# Patient Record
Sex: Male | Born: 1960 | Race: White | Hispanic: No | Marital: Married | Smoking: Never smoker
Health system: Southern US, Community
[De-identification: ages and names within clinical notes are randomized; demographics above are authoritative.]

## PROBLEM LIST (undated history)

## (undated) DIAGNOSIS — E041 Nontoxic single thyroid nodule: Secondary | ICD-10-CM

## (undated) DIAGNOSIS — C4490 Unspecified malignant neoplasm of skin, unspecified: Secondary | ICD-10-CM

## (undated) HISTORY — DX: Unspecified malignant neoplasm of skin, unspecified: C44.90

## (undated) HISTORY — PX: VASECTOMY: SHX75

## (undated) HISTORY — DX: Nontoxic single thyroid nodule: E04.1

---
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
## 2004-04-07 ENCOUNTER — Encounter: Admission: RE | Admit: 2004-04-07 | Discharge: 2004-04-07 | Payer: Self-pay | Admitting: Internal Medicine

## 2004-05-19 ENCOUNTER — Ambulatory Visit: Payer: Self-pay | Admitting: Internal Medicine

## 2004-07-22 ENCOUNTER — Ambulatory Visit: Payer: Self-pay | Admitting: Internal Medicine

## 2004-09-03 ENCOUNTER — Ambulatory Visit: Payer: Self-pay | Admitting: Family Medicine

## 2005-06-09 ENCOUNTER — Ambulatory Visit: Payer: Self-pay | Admitting: Internal Medicine

## 2006-02-17 ENCOUNTER — Ambulatory Visit: Payer: Self-pay | Admitting: Internal Medicine

## 2006-06-02 ENCOUNTER — Ambulatory Visit: Payer: Self-pay | Admitting: Internal Medicine

## 2006-10-03 ENCOUNTER — Ambulatory Visit: Payer: Self-pay | Admitting: Internal Medicine

## 2006-10-03 LAB — CONVERTED CEMR LAB: TSH: 1.22 microintl units/mL (ref 0.35–5.50)

## 2006-10-17 ENCOUNTER — Ambulatory Visit: Payer: Self-pay | Admitting: Internal Medicine

## 2007-01-02 ENCOUNTER — Telehealth: Payer: Self-pay | Admitting: Internal Medicine

## 2007-01-04 ENCOUNTER — Encounter: Admission: RE | Admit: 2007-01-04 | Discharge: 2007-01-04 | Payer: Self-pay | Admitting: Internal Medicine

## 2007-01-13 ENCOUNTER — Telehealth: Payer: Self-pay | Admitting: Internal Medicine

## 2007-10-24 ENCOUNTER — Ambulatory Visit: Payer: Self-pay | Admitting: Internal Medicine

## 2007-11-03 ENCOUNTER — Encounter: Admission: RE | Admit: 2007-11-03 | Discharge: 2007-11-03 | Payer: Self-pay | Admitting: Internal Medicine

## 2007-11-08 ENCOUNTER — Telehealth (INDEPENDENT_AMBULATORY_CARE_PROVIDER_SITE_OTHER): Payer: Self-pay | Admitting: *Deleted

## 2008-05-08 ENCOUNTER — Ambulatory Visit: Payer: Self-pay | Admitting: Internal Medicine

## 2008-06-27 ENCOUNTER — Ambulatory Visit: Payer: Self-pay | Admitting: Family Medicine

## 2009-02-27 ENCOUNTER — Telehealth: Payer: Self-pay | Admitting: Internal Medicine

## 2009-02-27 DIAGNOSIS — E042 Nontoxic multinodular goiter: Secondary | ICD-10-CM | POA: Insufficient documentation

## 2009-03-05 ENCOUNTER — Encounter (INDEPENDENT_AMBULATORY_CARE_PROVIDER_SITE_OTHER): Payer: Self-pay | Admitting: *Deleted

## 2009-03-07 ENCOUNTER — Encounter: Admission: RE | Admit: 2009-03-07 | Discharge: 2009-03-07 | Payer: Self-pay | Admitting: Internal Medicine

## 2009-03-13 ENCOUNTER — Encounter (INDEPENDENT_AMBULATORY_CARE_PROVIDER_SITE_OTHER): Payer: Self-pay | Admitting: *Deleted

## 2009-04-22 ENCOUNTER — Ambulatory Visit: Payer: Self-pay | Admitting: Internal Medicine

## 2009-05-09 ENCOUNTER — Ambulatory Visit: Payer: Self-pay | Admitting: Internal Medicine

## 2009-05-14 ENCOUNTER — Encounter (INDEPENDENT_AMBULATORY_CARE_PROVIDER_SITE_OTHER): Payer: Self-pay | Admitting: *Deleted

## 2009-05-14 LAB — CONVERTED CEMR LAB
ALT: 24 units/L (ref 0–53)
AST: 20 units/L (ref 0–37)
BUN: 12 mg/dL (ref 6–23)
Basophils Absolute: 0 10*3/uL (ref 0.0–0.1)
CO2: 30 meq/L (ref 19–32)
Direct LDL: 95.6 mg/dL
Eosinophils Absolute: 0.3 10*3/uL (ref 0.0–0.7)
GFR calc non Af Amer: 84.17 mL/min (ref >60)
Glucose, Bld: 91 mg/dL (ref 70–99)
HDL: 24.8 mg/dL — ABNORMAL LOW (ref >39.00)
Hemoglobin: 15.9 g/dL (ref 13.0–17.0)
Lymphocytes Relative: 25.3 % (ref 12.0–46.0)
MCHC: 35.1 g/dL (ref 30.0–36.0)
Monocytes Relative: 7.7 % (ref 3.0–12.0)
Neutro Abs: 5.6 10*3/uL (ref 1.4–7.7)
Neutrophils Relative %: 63.5 % (ref 43.0–77.0)
PSA: 0.76 ng/mL (ref 0.10–4.00)
Platelets: 183 10*3/uL (ref 150.0–400.0)
Potassium: 3.8 meq/L (ref 3.5–5.1)
RDW: 11.5 % (ref 11.5–14.6)
Total CHOL/HDL Ratio: 6

## 2009-06-23 ENCOUNTER — Encounter: Payer: Self-pay | Admitting: Internal Medicine

## 2010-03-02 ENCOUNTER — Telehealth (INDEPENDENT_AMBULATORY_CARE_PROVIDER_SITE_OTHER): Payer: Self-pay | Admitting: *Deleted

## 2010-03-09 ENCOUNTER — Encounter (INDEPENDENT_AMBULATORY_CARE_PROVIDER_SITE_OTHER): Payer: Self-pay | Admitting: *Deleted

## 2010-04-03 ENCOUNTER — Encounter: Admission: RE | Admit: 2010-04-03 | Discharge: 2010-04-03 | Payer: Self-pay | Admitting: Internal Medicine

## 2010-08-09 ENCOUNTER — Encounter: Payer: Self-pay | Admitting: Internal Medicine

## 2010-08-18 NOTE — Letter (Signed)
Summary: Unable To Reach-Consult Scheduled  [facility] [place]  [address] [place], [place] [address]   Phone: [phone]  Fax: [phone]    03/09/2010 MRN: [id number]    Dear Mr. Currie,   We have been unable to reach you by phone.  Please contact our office with an updated phone number.       Thank you,  Army Fossa CMA  March 09, 2010 9:01 AM

## 2010-08-18 NOTE — Progress Notes (Signed)
Summary: [place]  (lmom 8/15, 8/16, 8/19)  ---- Converted from flag ---- ---- 01/27/2010 12:03 PM, Cristy Hilts, RN wrote:   ---- 01/27/2010 11:41 AM, Cristy Hilts, RN wrote:   ---- 03/10/2009 9:00 AM, Nolon Rod. Paz MD wrote: u/s due ------------------------------  Left message for pt to call back. Left message for pt to call back. Army Fossa CMA  March 03, 2010 12:55 PM  [email or web address]  March 06, 2010 12:02 PM    will mail pt a letter. Army Fossa CMA  March 09, 2010 8:59 AM  Appended Document: [place]  (lmom 8/15, 8/16, 8/19) Pt called back and he is aware, will schedule [place].   Clinical Lists Changes  Orders: Added new Referral order of Radiology Referral (Radiology) - Signed

## 2011-08-26 ENCOUNTER — Encounter: Payer: Self-pay | Admitting: *Deleted

## 2011-08-26 ENCOUNTER — Encounter: Payer: Self-pay | Admitting: Internal Medicine

## 2011-08-26 ENCOUNTER — Ambulatory Visit (INDEPENDENT_AMBULATORY_CARE_PROVIDER_SITE_OTHER): Payer: BC Managed Care – PPO | Admitting: Internal Medicine

## 2011-08-26 DIAGNOSIS — Z0000 Encounter for general adult medical examination without abnormal findings: Secondary | ICD-10-CM | POA: Insufficient documentation

## 2011-08-26 DIAGNOSIS — Z8639 Personal history of other endocrine, nutritional and metabolic disease: Secondary | ICD-10-CM

## 2011-08-26 DIAGNOSIS — Z862 Personal history of diseases of the blood and blood-forming organs and certain disorders involving the immune mechanism: Secondary | ICD-10-CM

## 2011-08-26 LAB — LIPID PANEL
Cholesterol: 196 mg/dL (ref 0–200)
Total CHOL/HDL Ratio: 6
Triglycerides: 265 mg/dL — ABNORMAL HIGH (ref 0.0–149.0)

## 2011-08-26 LAB — CBC WITH DIFFERENTIAL/PLATELET
Eosinophils Absolute: 0.3 10*3/uL (ref 0.0–0.7)
Eosinophils Relative: 3.2 % (ref 0.0–5.0)
HCT: 46.4 % (ref 39.0–52.0)
Lymphs Abs: 2 10*3/uL (ref 0.7–4.0)
MCHC: 34.7 g/dL (ref 30.0–36.0)
MCV: 92.3 fl (ref 78.0–100.0)
Monocytes Absolute: 0.7 10*3/uL (ref 0.1–1.0)
Neutrophils Relative %: 67.3 % (ref 43.0–77.0)
Platelets: 173 10*3/uL (ref 150.0–400.0)
WBC: 9.5 10*3/uL (ref 4.5–10.5)

## 2011-08-26 LAB — COMPREHENSIVE METABOLIC PANEL
ALT: 27 U/L (ref 0–53)
AST: 22 U/L (ref 0–37)
Albumin: 4.1 g/dL (ref 3.5–5.2)
Alkaline Phosphatase: 64 U/L (ref 39–117)
Potassium: 4.8 mEq/L (ref 3.5–5.1)
Sodium: 138 mEq/L (ref 135–145)
Total Bilirubin: 0.7 mg/dL (ref 0.3–1.2)
Total Protein: 6.8 g/dL (ref 6.0–8.3)

## 2011-08-26 LAB — LDL CHOLESTEROL, DIRECT: Direct LDL: 113.1 mg/dL

## 2011-08-26 LAB — TSH: TSH: 1.15 u[IU]/mL (ref 0.35–5.50)

## 2011-08-26 NOTE — Assessment & Plan Note (Addendum)
Will schedule a ultrasound

## 2011-08-26 NOTE — Assessment & Plan Note (Addendum)
Td 07 Had a flu shot  never have a Cscope, discussed Cscope vs iFOB, iFOB provided, will call when ready for a colonoscopy. Labs Diet and exercise discussed

## 2011-08-26 NOTE — Progress Notes (Signed)
  Subjective:    Patient ID: Nicholas Fox, male    DOB: March 09, 1961, 51 y.o.   MRN: [id number]  HPI Complete physical exam  Past Medical History: skin Ca - followed by derm Thyroid Nodules, s/p U/S x 3, last 03-2010  Past Surgical History: Vasectomy  Family History: HTN - m DM - -- B Leukopenia? -- B CAD - no prostate Ca - no colon ca - no  Social History: Married, 3 children Alcohol use-yes Never Smoked Drug use-no Regular exercise--some , twice a week diet-- does watch , describe his diet is healthy   Review of Systems Denies fever chills or unexplained weight loss No chest pain, shortness of breath or palpitations. Occasional cough in the morning with some mucus production without wheezing. No nausea, vomiting, diarrhea or stomach pain. No heartburn. Rarely he see red blood in the stools, believed to be due to hemorrhoids. No dysuria, gross hematuria or difficulty urinating. No anxiety or depression.     Objective:   Physical Exam  Constitutional: He is oriented to person, place, and time. He appears well-developed and well-nourished. No distress.  HENT:  Head: Normocephalic and atraumatic.  Neck:       Barely palpable thyroid, not tender or nodular  Cardiovascular: Normal rate, regular rhythm and normal heart sounds.   No murmur heard. Pulmonary/Chest: Effort normal and breath sounds normal. No respiratory distress. He has no wheezes. He has no rales.  Abdominal: Soft. Bowel sounds are normal. He exhibits no distension. There is no tenderness. There is no rebound and no guarding.  Genitourinary: Rectum normal and prostate normal. Guaiac negative stool.       Sternal hemorrhoids noted  Musculoskeletal: He exhibits no edema.  Neurological: He is alert and oriented to person, place, and time.  Skin: He is not diaphoretic.  Psychiatric: He has a normal mood and affect. His behavior is normal. Judgment and thought content normal.      Assessment & Plan:

## 2011-08-26 NOTE — Patient Instructions (Signed)
Exercise: 3 hours a week, heart rate ~ 140

## 2011-08-30 ENCOUNTER — Ambulatory Visit (HOSPITAL_BASED_OUTPATIENT_CLINIC_OR_DEPARTMENT_OTHER)
Admission: RE | Admit: 2011-08-30 | Discharge: 2011-08-30 | Disposition: A | Discharge status: Home / Self Care | Payer: BC Managed Care – PPO | Source: Ambulatory Visit | Attending: Internal Medicine | Admitting: Internal Medicine

## 2011-08-30 DIAGNOSIS — E041 Nontoxic single thyroid nodule: Secondary | ICD-10-CM

## 2011-08-30 DIAGNOSIS — Z862 Personal history of diseases of the blood and blood-forming organs and certain disorders involving the immune mechanism: Secondary | ICD-10-CM

## 2011-08-30 DIAGNOSIS — E049 Nontoxic goiter, unspecified: Secondary | ICD-10-CM | POA: Insufficient documentation

## 2011-08-30 DIAGNOSIS — Z09 Encounter for follow-up examination after completed treatment for conditions other than malignant neoplasm: Secondary | ICD-10-CM | POA: Insufficient documentation

## 2011-10-14 ENCOUNTER — Other Ambulatory Visit: Payer: Self-pay | Admitting: *Deleted

## 2011-10-14 DIAGNOSIS — Z0000 Encounter for general adult medical examination without abnormal findings: Secondary | ICD-10-CM

## 2012-09-02 ENCOUNTER — Other Ambulatory Visit: Payer: Self-pay

## 2013-05-24 ENCOUNTER — Other Ambulatory Visit: Payer: Self-pay

## 2014-01-04 ENCOUNTER — Encounter: Payer: Self-pay | Admitting: Internal Medicine

## 2014-01-07 ENCOUNTER — Encounter: Payer: Self-pay | Admitting: Internal Medicine

## 2014-02-22 ENCOUNTER — Ambulatory Visit (AMBULATORY_SURGERY_CENTER): Payer: Self-pay | Admitting: *Deleted

## 2014-02-22 VITALS — Ht 70.0 in | Wt 222.6 lb

## 2014-02-22 DIAGNOSIS — Z1211 Encounter for screening for malignant neoplasm of colon: Secondary | ICD-10-CM

## 2014-02-22 MED ORDER — MOVIPREP 100 G PO SOLR: ORAL | Status: DC

## 2014-02-22 NOTE — Progress Notes (Signed)
No allergies to eggs or soy. No problems with anesthesia.  Pt given Emmi instructions for colonoscopy  No oxygen use  No diet drug use  

## 2014-02-25 ENCOUNTER — Encounter: Payer: Self-pay | Admitting: Internal Medicine

## 2014-03-06 ENCOUNTER — Ambulatory Visit (AMBULATORY_SURGERY_CENTER): Payer: BC Managed Care – PPO | Admitting: Internal Medicine

## 2014-03-06 ENCOUNTER — Encounter: Payer: Self-pay | Admitting: Internal Medicine

## 2014-03-06 VITALS — BP 149/91 | HR 62 | Temp 97.7°F | Resp 22 | Ht 70.0 in | Wt 222.0 lb

## 2014-03-06 DIAGNOSIS — Z1211 Encounter for screening for malignant neoplasm of colon: Secondary | ICD-10-CM

## 2014-03-06 MED ORDER — SODIUM CHLORIDE 0.9 % IV SOLN: 500.0000 mL | INTRAVENOUS | Status: DC

## 2014-03-06 NOTE — Patient Instructions (Signed)
YOU HAD AN ENDOSCOPIC PROCEDURE TODAY AT THE [facility]: Refer to the procedure report that was given to you for any specific questions about what was found during the examination.  If the procedure report does not answer your questions, please call your gastroenterologist to clarify.  If you requested that your care partner not be given the details of your procedure findings, then the procedure report has been included in a sealed envelope for you to review at your convenience later.  YOU SHOULD EXPECT: Some feelings of bloating in the abdomen. Passage of more gas than usual.  Walking can help get rid of the air that was put into your GI tract during the procedure and reduce the bloating. If you had a lower endoscopy (such as a colonoscopy or flexible sigmoidoscopy) you may notice spotting of blood in your stool or on the toilet paper. If you underwent a bowel prep for your procedure, then you may not have a normal bowel movement for a few days.  DIET: Your first meal following the procedure should be a light meal and then it is ok to progress to your normal diet.  A half-sandwich or bowl of soup is an example of a good first meal.  Heavy or fried foods are harder to digest and may make you feel nauseous or bloated.  Likewise meals heavy in dairy and vegetables can cause extra gas to form and this can also increase the bloating.  Drink plenty of fluids but you should avoid alcoholic beverages for 24 hours.  ACTIVITY: Your care partner should take you home directly after the procedure.  You should plan to take it easy, moving slowly for the rest of the day.  You can resume normal activity the day after the procedure however you should NOT DRIVE or use heavy machinery for 24 hours (because of the sedation medicines used during the test).    SYMPTOMS TO REPORT IMMEDIATELY: A gastroenterologist can be reached at any hour.  During normal business hours, 8:30 AM to 5:00 PM Monday through Friday,  call [phone].  After hours and on weekends, please call the GI answering service at [phone] who will take a message and have the physician on call contact you.   Following lower endoscopy (colonoscopy or flexible sigmoidoscopy):  Excessive amounts of blood in the stool  Significant tenderness or worsening of abdominal pains  Swelling of the abdomen that is new, acute  Fever of 100F or higher    FOLLOW UP: If any biopsies were taken you will be contacted by phone or by letter within the next 1-3 weeks.  Call your gastroenterologist if you have not heard about the biopsies in 3 weeks.  Our staff will call the home number listed on your records the next business day following your procedure to check on you and address any questions or concerns that you may have at that time regarding the information given to you following your procedure. This is a courtesy call and so if there is no answer at the home number and we have not heard from you through the emergency physician on call, we will assume that you have returned to your regular daily activities without incident.  SIGNATURES/CONFIDENTIALITY: You and/or your care partner have signed paperwork which will be entered into your electronic medical record.  These signatures attest to the fact that that the information above on your After Visit Summary has been reviewed and is understood.  Full responsibility of the confidentiality   this discharge information lies with you and/or your care-partner.   Information on diverticulosis and high fiber diet given to you today 

## 2014-03-06 NOTE — Op Note (Signed)
[facility]  [address] [place], [id number]   COLONOSCOPY PROCEDURE REPORT  PATIENT: Nicholas Fox, Nicholas Fox  MR#: [id number] BIRTHDATE: 03/17/1961 , 60  yrs. old GENDER: Male ENDOSCOPIST: Eustace Quail, MD REFERRED BW:IOMB Larose Kells, M.D. PROCEDURE DATE:  03/06/2014 PROCEDURE:   Colonoscopy, screening First Screening Colonoscopy - Avg.  risk and is 50 yrs.  old or older Yes.  Prior Negative Screening - Now for repeat screening. N/A  History of Adenoma - Now for follow-up colonoscopy & has been > or = to 3 yrs.  N/A  Polyps Removed Today? No.  Recommend repeat exam, <10 yrs? No. ASA CLASS:   Class I INDICATIONS:average risk screening. MEDICATIONS: MAC sedation, administered by CRNA and propofol (Diprivan) 400mg  IV  DESCRIPTION OF PROCEDURE:   After the risks benefits and alternatives of the procedure were thoroughly explained, informed consent was obtained.  A digital rectal exam revealed no abnormalities of the rectum.   The LB [id number]  endoscope was introduced through the anus and advanced to the cecum, which was identified by both the appendix and ileocecal valve. No adverse events experienced.   The quality of the prep was good, using MoviPrep  The instrument was then slowly withdrawn as the colon was fully examined.  COLON FINDINGS: Moderate diverticulosis was noted  in the right colon and  left colon.   The colon was otherwise normal.  There was no  inflammation, polyps or cancers unless previously stated. Retroflexed views revealed internal hemorrhoids. The time to cecum=4 minutes 01 seconds.  Withdrawal time=12 minutes 30 seconds. The scope was withdrawn and the procedure completed.  COMPLICATIONS: There were no complications.  ENDOSCOPIC IMPRESSION: 1.   Moderate diverticulosis was noted in the right colon and left colon 2.   The colon was otherwise normal  RECOMMENDATIONS: 1. Continue current colorectal screening recommendations for "routine  risk" patients with a repeat colonoscopy in 10 years.   eSigned:  Eustace Quail, MD 03/06/2014 12:20 PM   cc: Kathlene November, MD and The Patient

## 2014-03-06 NOTE — Progress Notes (Signed)
A/ox3, pleased with MAC, report to RN 

## 2014-03-07 ENCOUNTER — Telehealth: Payer: Self-pay

## 2014-03-07 NOTE — Telephone Encounter (Signed)
  Follow up Call-  Call back number 03/06/2014  Post procedure Call Back phone  # [phone]  Permission to leave phone message Yes     Patient questions:  Do you have a fever, pain , or abdominal swelling? No. Pain Score  0 *  Have you tolerated food without any problems? Yes.    Have you been able to return to your normal activities? Yes.    Do you have any questions about your discharge instructions: Diet   No. Medications  No. Follow up visit  No.  Do you have questions or concerns about your Care? No.  Actions: * If pain score is 4 or above: No action needed, pain <4.  No problems per the pt. maw

## 2014-04-25 ENCOUNTER — Ambulatory Visit (INDEPENDENT_AMBULATORY_CARE_PROVIDER_SITE_OTHER): Payer: BC Managed Care – PPO | Admitting: Internal Medicine

## 2014-04-25 ENCOUNTER — Encounter: Payer: Self-pay | Admitting: Internal Medicine

## 2014-04-25 VITALS — BP 120/80 | HR 71 | Temp 98.4°F | Ht 70.0 in | Wt 223.5 lb

## 2014-04-25 DIAGNOSIS — J209 Acute bronchitis, unspecified: Secondary | ICD-10-CM | POA: Insufficient documentation

## 2014-04-25 DIAGNOSIS — J208 Acute bronchitis due to other specified organisms: Secondary | ICD-10-CM

## 2014-04-25 MED ORDER — LEVOFLOXACIN 500 MG PO TABS: 500.0000 mg | ORAL_TABLET | Freq: Every day | ORAL | Status: DC

## 2014-04-25 MED ORDER — HYDROCODONE-HOMATROPINE 5-1.5 MG/5ML PO SYRP: 5.0000 mL | ORAL_SOLUTION | Freq: Four times a day (QID) | ORAL | Status: DC | PRN

## 2014-04-25 NOTE — Progress Notes (Signed)
Pre visit review using our clinic review tool, if applicable. No additional management support is needed unless otherwise documented below in the visit note. 

## 2014-04-25 NOTE — Assessment & Plan Note (Addendum)
Mild to mod, for antibx course,  to f/u any worsening symptoms or concerns, declines cxr 

## 2014-04-25 NOTE — Progress Notes (Signed)
   Subjective:    Patient ID: Nicholas Fox, male    DOB: January 03, 1961, 54 y.o.   MRN: [id number]  HPI  Here with acute onset mild to mod 2-3 wks ST, HA, general weakness and malaise, with prod cough greenish sputum, but Pt denies chest pain, increased sob or doe, wheezing, orthopnea, PND, increased LE swelling, palpitations, dizziness or syncope.  Concerned about pna. Past Medical History  Diagnosis Date  . Thyroid nodule    Past Surgical History  Procedure Laterality Date  . Wisdom tooth extraction  1979    reports that he has never smoked. He has never used smokeless tobacco. He reports that he drinks about .6 ounces of alcohol per week. He reports that he does not use illicit drugs. family history is negative for Colon cancer. No Known Allergies No current outpatient prescriptions on file prior to visit.   No current facility-administered medications on file prior to visit.   Review of Systems All otherwise neg per pt     Objective:   Physical Exam BP 120/80  Pulse 71  Temp(Src) 98.4 F (36.9 C) (Oral)  Ht 5\' 10"  (1.778 m)  Wt 223 lb 8 oz (101.379 kg)  BMI 32.07 kg/m2  SpO2 96% VS noted, mild ill Constitutional: Pt appears well-developed, well-nourished.  HENT: Head: NCAT.  Right Ear: External ear normal.  Left Ear: External ear normal.  Eyes: . Pupils are equal, round, and reactive to light. Conjunctivae and EOM are normal Bilat tm's with mild erythema.  Max sinus areas non tender.  Pharynx with mild erythema, no exudate Neck: Normal range of motion. Neck supple.  Cardiovascular: Normal rate and regular rhythm.   Pulmonary/Chest: Effort normal and breath sounds normal.  - no rales or wheezing Neurological: Pt is alert. Not confused , motor grossly intact Skin: Skin is warm. No rash Psychiatric: Pt behavior is normal. No agitation.     Assessment & Plan:

## 2014-04-25 NOTE — Patient Instructions (Signed)
Please take all new medication as prescribed - the antibiotic, and cough medicine  Please continue all other medications as before, and refills have been done if requested.  Please have the pharmacy call with any other refills you may need.  Please keep your appointments with your specialists as you may have planned      

## 2014-10-23 ENCOUNTER — Other Ambulatory Visit: Payer: Self-pay

## 2014-10-31 ENCOUNTER — Telehealth: Payer: Self-pay | Admitting: Internal Medicine

## 2014-10-31 NOTE — Telephone Encounter (Signed)
Pre visit letter sent  °

## 2014-11-18 ENCOUNTER — Ambulatory Visit (INDEPENDENT_AMBULATORY_CARE_PROVIDER_SITE_OTHER): Payer: BLUE CROSS/BLUE SHIELD | Admitting: Internal Medicine

## 2014-11-18 ENCOUNTER — Encounter: Payer: Self-pay | Admitting: Internal Medicine

## 2014-11-18 VITALS — BP 112/64 | HR 74 | Temp 97.8°F | Ht 70.0 in | Wt 222.0 lb

## 2014-11-18 DIAGNOSIS — Z0000 Encounter for general adult medical examination without abnormal findings: Secondary | ICD-10-CM

## 2014-11-18 DIAGNOSIS — C4490 Unspecified malignant neoplasm of skin, unspecified: Secondary | ICD-10-CM | POA: Insufficient documentation

## 2014-11-18 DIAGNOSIS — E042 Nontoxic multinodular goiter: Secondary | ICD-10-CM

## 2014-11-18 MED ORDER — ZOSTER VACCINE LIVE 19400 UNT/0.65ML ~~LOC~~ SOLR: 0.6500 mL | Freq: Once | SUBCUTANEOUS | Status: DC

## 2014-11-18 NOTE — Assessment & Plan Note (Signed)
Previous ultrasound 2013 revealed stable nodules. No symptoms of hypo-/hyperthyroidism. Will recheck TSH today.

## 2014-11-18 NOTE — Progress Notes (Signed)
Subjective:    Patient ID: Nicholas Fox, male    DOB: 09/19/1960, 54 y.o.   MRN: [id number]  DOS:  11/18/2014 Type of visit - description : cpx Interval history:  Patient is a 54 year old male with a history significant for thyroid nodules in today to re-establish care.   Patient notes occasional pain localized to bilateral achilles tendons. Pain is more significant following activity. Patient has not tried medication to alleviate pain. Patient notes pain usually subsides with rest.  Patient also notes diet and exercise could be better. Patient is attempting to watch diet and get exercise, but there could be room for improvement.     Review of Systems  Constitutional: No fever, chills. No unexplained wt changes. No unusual sweats HEENT: No dental problems, ear discharge, facial swelling, voice changes. No eye discharge, redness or intolerance to light Respiratory: No wheezing or difficulty breathing. No cough , mucus production Cardiovascular: No CP, leg swelling or palpitations GI: no nausea, vomiting, diarrhea or abdominal pain.  No blood in the stools. No dysphagia   Endocrine: No polyphagia, polyuria or polydipsia GU: No dysuria, gross hematuria, difficulty urinating. No urinary urgency or frequency. Musculoskeletal: No joint swellings. Pain over bilateral achilles tendons. Skin: No change in the color of the skin, palor or rash Allergic, immunologic: No environmental allergies or food allergies Neurological: No dizziness or syncope. No headaches. No diplopia, slurred speech, motor deficits, facial numbness Hematological: No enlarged lymph nodes, easy bruising or bleeding Psychiatry: No suicidal ideas, hallucinations, behavior problems or confusion. No unusual/severe anxiety or depression.      Past Medical History  Diagnosis Date  . Thyroid nodule     s/p U/S x 3, last 03-2010  . Skin cancer     Sees dermatology    Past Surgical History  Procedure Laterality Date  . Wisdom  tooth extraction  1979  . Vasectomy      History   Social History  . Marital Status: Married    Spouse Name: N/A  . Number of Children: 3  . Years of Education: N/A   Occupational History  . Real Estate Appraisal    Social History Main Topics  . Smoking status: Never Smoker   . Smokeless tobacco: Never Used  . Alcohol Use: 0.6 oz/week    1 Cans of beer per week     Comment: social  . Drug Use: No  . Sexual Activity: Not on file   Other Topics Concern  . Not on file   Social History Narrative     Family History  Problem Relation Age of Onset  . Colon cancer Neg Hx   . Prostate cancer Neg Hx   . Hypertension Mother   . Diabetes Brother   . CAD Neg Hx     Family History  Problem Relation Age of Onset  . Colon cancer Neg Hx   . Prostate cancer Neg Hx   . Hypertension Mother   . Diabetes Brother   . CAD Neg Hx       Medication List       This list is accurate as of: 11/18/14  8:57 PM.  Always use your most recent med list.               zoster vaccine live (PF) 19400 UNT/0.65ML injection  Commonly known as:  ZOSTAVAX  Inject 19,400 Units into the skin once.           Objective:   Physical Exam  BP 112/64 mmHg  Pulse 74  Temp(Src) 97.8 F (36.6 C) (Oral)  Ht 5\' 10"  (1.778 m)  Wt 222 lb (100.699 kg)  BMI 31.85 kg/m2  SpO2 98%  General:   Well developed, well nourished . NAD.  Neck:  Full range of motion. Supple. No  thyromegaly , normal carotid pulse HEENT:  Normocephalic . Face symmetric, atraumatic Lungs:  CTA B Normal respiratory effort, no intercostal retractions, no accessory muscle use. Heart: RRR,  no murmur.  Abdomen:  Not distended, soft, non-tender. No rebound or rigidity. No mass,organomegaly Muscle skeletal: no pretibial edema bilaterally  Achilles tendons mildly TTP bilaterally otherwise ankle-feet exam neg Skin: Exposed areas without rash. Not pale. Not jaundice Neurologic:  alert & oriented X3.  Speech normal, gait  appropriate for age and unassisted Psych: Cognition and judgment appear intact.  Cooperative with normal attention span and concentration.  Behavior appropriate. No anxious or depressed appearing. Rectal:  External abnormalities: single external hemorrhoid located at proximal right aspect of sphincter. Normal sphincter tone. No rectal masses or tenderness.  Stool brown  Prostate: Prostate gland firm and smooth, no enlargement, nodularity, tenderness, mass, asymmetry or induration.         Assessment & Plan:  Achilles Tendonitis: Patient notes pain over bilateral achilles tendons occasionally. Patient advised on importance of stretching to reduce tightness before activity. Patient instructed to inform us if symptoms worsen.

## 2014-11-18 NOTE — Patient Instructions (Signed)
Get your blood work before you leave    Come back to the office  In 1 year  for a physical exam  Please schedule an appointment at the front desk    Come back fasting

## 2014-11-18 NOTE — Progress Notes (Deleted)
   Subjective:    Patient ID: Nicholas Fox, male    DOB: 1961/07/01, 54 y.o.   MRN: [id number]  DOS:  11/18/2014 Type of visit - description : CPX Interval history:  Past Medical History: skin Ca - followed by derm Thyroid Nodules, s/p U/S x 3, last 03-2010  Past Surgical History: Vasectomy  Family History: HTN - m DM - -- B Leukopenia? -- B CAD - no prostate Ca - no colon ca - no  Social History: Married, 3 children Alcohol use-yes Never Smoked Drug use-no    Review of Systems   Past Medical History  Diagnosis Date  . Thyroid nodule     Past Surgical History  Procedure Laterality Date  . Wisdom tooth extraction  1979    History   Social History  . Marital Status: Married    Spouse Name: N/A  . Number of Children: N/A  . Years of Education: N/A   Occupational History  . Real Estate Appraisal    Social History Main Topics  . Smoking status: Never Smoker   . Smokeless tobacco: Never Used  . Alcohol Use: 0.6 oz/week    1 Cans of beer per week  . Drug Use: No  . Sexual Activity: Not on file   Other Topics Concern  . Not on file   Social History Narrative        Medication List    Notice  As of 11/18/2014  1:54 PM   You have not been prescribed any medications.         Objective:   Physical Exam BP 112/64 mmHg  Pulse 74  Temp(Src) 97.8 F (36.6 C) (Oral)  Ht 5\' 10"  (1.778 m)  Wt 222 lb (100.699 kg)  BMI 31.85 kg/m2  SpO2 98%       Assessment & Plan:

## 2014-11-18 NOTE — Progress Notes (Signed)
Pre visit review using our clinic review tool, if applicable. No additional management support is needed unless otherwise documented below in the visit note. 

## 2014-11-18 NOTE — Assessment & Plan Note (Addendum)
Td 07 Pneumovax discussed, will wait Zostavax discussed, Rx given for vaccine - patient will discuss with insurance.  HIV screen declined  EKG today - nsr, unchanged from previous  Colonoscopy 8/15 - moderate tics, no polyps, next cscope in 2025 Labs - CMP/CBC/FLP/TSH/PSA  Diet and exercise discussed, patient provided information regarding My[employer]itnessPal and other methods of weight loss. Importance of exercise discussed as well.

## 2014-11-19 LAB — COMPREHENSIVE METABOLIC PANEL
ALBUMIN: 4.1 g/dL (ref 3.5–5.2)
ALK PHOS: 66 U/L (ref 39–117)
ALT: 19 U/L (ref 0–53)
AST: 21 U/L (ref 0–37)
BILIRUBIN TOTAL: 0.6 mg/dL (ref 0.2–1.2)
BUN: 15 mg/dL (ref 6–23)
CO2: 30 mEq/L (ref 19–32)
CREATININE: 1.04 mg/dL (ref 0.40–1.50)
Calcium: 9.6 mg/dL (ref 8.4–10.5)
Chloride: 103 mEq/L (ref 96–112)
GFR: 78.74 mL/min (ref >60.00)
GLUCOSE: 93 mg/dL (ref 70–99)
Potassium: 4.1 mEq/L (ref 3.5–5.1)
Sodium: 139 mEq/L (ref 135–145)
Total Protein: 6.7 g/dL (ref 6.0–8.3)

## 2014-11-19 LAB — LIPID PANEL
CHOLESTEROL: 174 mg/dL (ref 0–200)
HDL: 29.5 mg/dL — ABNORMAL LOW (ref >39.00)
NONHDL: 144.5
TRIGLYCERIDES: 307 mg/dL — AB (ref 0.0–149.0)
Total CHOL/HDL Ratio: 6
VLDL: 61.4 mg/dL — ABNORMAL HIGH (ref 0.0–40.0)

## 2014-11-19 LAB — CBC WITH DIFFERENTIAL/PLATELET
Basophils Absolute: 0.1 10*3/uL (ref 0.0–0.1)
Basophils Relative: 1.3 % (ref 0.0–3.0)
EOS PCT: 2.2 % (ref 0.0–5.0)
Eosinophils Absolute: 0.2 10*3/uL (ref 0.0–0.7)
HEMATOCRIT: 45.3 % (ref 39.0–52.0)
Hemoglobin: 15.9 g/dL (ref 13.0–17.0)
Lymphocytes Relative: 22.7 % (ref 12.0–46.0)
Lymphs Abs: 2 10*3/uL (ref 0.7–4.0)
MCHC: 35.1 g/dL (ref 30.0–36.0)
MCV: 90.2 fl (ref 78.0–100.0)
Monocytes Absolute: 0.5 10*3/uL (ref 0.1–1.0)
Monocytes Relative: 5.8 % (ref 3.0–12.0)
Neutro Abs: 6 10*3/uL (ref 1.4–7.7)
Neutrophils Relative %: 68 % (ref 43.0–77.0)
PLATELETS: 191 10*3/uL (ref 150.0–400.0)
RBC: 5.02 Mil/uL (ref 4.22–5.81)
RDW: 12.8 % (ref 11.5–15.5)
WBC: 8.8 10*3/uL (ref 4.0–10.5)

## 2014-11-19 LAB — PSA: PSA: 0.86 ng/mL (ref 0.10–4.00)

## 2014-11-19 LAB — LDL CHOLESTEROL, DIRECT: Direct LDL: 94 mg/dL

## 2014-11-19 LAB — TSH: TSH: 1.06 u[IU]/mL (ref 0.35–4.50)

## 2016-10-11 DIAGNOSIS — D225 Melanocytic nevi of trunk: Secondary | ICD-10-CM | POA: Diagnosis not present

## 2016-10-11 DIAGNOSIS — D485 Neoplasm of uncertain behavior of skin: Secondary | ICD-10-CM | POA: Diagnosis not present

## 2016-10-11 DIAGNOSIS — C4441 Basal cell carcinoma of skin of scalp and neck: Secondary | ICD-10-CM | POA: Diagnosis not present

## 2016-10-11 DIAGNOSIS — D2261 Melanocytic nevi of right upper limb, including shoulder: Secondary | ICD-10-CM | POA: Diagnosis not present

## 2016-11-30 DIAGNOSIS — C4441 Basal cell carcinoma of skin of scalp and neck: Secondary | ICD-10-CM | POA: Diagnosis not present

## 2017-04-20 DIAGNOSIS — L570 Actinic keratosis: Secondary | ICD-10-CM | POA: Diagnosis not present

## 2017-04-20 DIAGNOSIS — Z23 Encounter for immunization: Secondary | ICD-10-CM | POA: Diagnosis not present

## 2017-09-06 ENCOUNTER — Encounter: Payer: BLUE CROSS/BLUE SHIELD | Admitting: Internal Medicine

## 2017-09-08 ENCOUNTER — Telehealth: Payer: Self-pay

## 2017-09-08 DIAGNOSIS — Z0000 Encounter for general adult medical examination without abnormal findings: Secondary | ICD-10-CM

## 2017-09-08 NOTE — Telephone Encounter (Signed)
Fasting labs: CMP, FLP, CBC, TSH, PSA

## 2017-09-08 NOTE — Telephone Encounter (Signed)
Orders placed.

## 2017-09-08 NOTE — Telephone Encounter (Signed)
Pt has appt tomorrow for cpe at 1:20PM- requesting lab orders to come early for draw. Please advise.

## 2017-09-09 ENCOUNTER — Encounter: Payer: Self-pay | Admitting: Internal Medicine

## 2017-09-09 ENCOUNTER — Other Ambulatory Visit (INDEPENDENT_AMBULATORY_CARE_PROVIDER_SITE_OTHER): Payer: 59

## 2017-09-09 ENCOUNTER — Ambulatory Visit (INDEPENDENT_AMBULATORY_CARE_PROVIDER_SITE_OTHER): Payer: 59 | Admitting: Internal Medicine

## 2017-09-09 VITALS — BP 128/64 | HR 64 | Temp 98.1°F | Resp 14 | Ht 70.0 in | Wt 218.1 lb

## 2017-09-09 DIAGNOSIS — E042 Nontoxic multinodular goiter: Secondary | ICD-10-CM

## 2017-09-09 DIAGNOSIS — Z0000 Encounter for general adult medical examination without abnormal findings: Secondary | ICD-10-CM | POA: Diagnosis not present

## 2017-09-09 DIAGNOSIS — Z23 Encounter for immunization: Secondary | ICD-10-CM | POA: Diagnosis not present

## 2017-09-09 LAB — CBC WITH DIFFERENTIAL/PLATELET
Basophils Absolute: 0.1 10*3/uL (ref 0.0–0.1)
Basophils Relative: 0.6 % (ref 0.0–3.0)
EOS ABS: 0.2 10*3/uL (ref 0.0–0.7)
EOS PCT: 2.3 % (ref 0.0–5.0)
HCT: 47.9 % (ref 39.0–52.0)
Hemoglobin: 16.5 g/dL (ref 13.0–17.0)
LYMPHS ABS: 2.3 10*3/uL (ref 0.7–4.0)
Lymphocytes Relative: 28.3 % (ref 12.0–46.0)
MCHC: 34.3 g/dL (ref 30.0–36.0)
MCV: 92.8 fl (ref 78.0–100.0)
MONO ABS: 0.6 10*3/uL (ref 0.1–1.0)
Monocytes Relative: 7.9 % (ref 3.0–12.0)
NEUTROS PCT: 60.9 % (ref 43.0–77.0)
Neutro Abs: 4.9 10*3/uL (ref 1.4–7.7)
Platelets: 205 10*3/uL (ref 150.0–400.0)
RBC: 5.17 Mil/uL (ref 4.22–5.81)
RDW: 12.7 % (ref 11.5–15.5)
WBC: 8 10*3/uL (ref 4.0–10.5)

## 2017-09-09 LAB — COMPREHENSIVE METABOLIC PANEL
ALBUMIN: 4.5 g/dL (ref 3.5–5.2)
ALK PHOS: 58 U/L (ref 39–117)
ALT: 17 U/L (ref 0–53)
AST: 19 U/L (ref 0–37)
BUN: 15 mg/dL (ref 6–23)
CO2: 30 mEq/L (ref 19–32)
Calcium: 9.8 mg/dL (ref 8.4–10.5)
Chloride: 103 mEq/L (ref 96–112)
Creatinine, Ser: 0.98 mg/dL (ref 0.40–1.50)
GFR: 83.49 mL/min (ref >60.00)
Glucose, Bld: 100 mg/dL — ABNORMAL HIGH (ref 70–99)
POTASSIUM: 4.1 meq/L (ref 3.5–5.1)
SODIUM: 139 meq/L (ref 135–145)
TOTAL PROTEIN: 6.8 g/dL (ref 6.0–8.3)
Total Bilirubin: 0.8 mg/dL (ref 0.2–1.2)

## 2017-09-09 LAB — LIPID PANEL
CHOLESTEROL: 201 mg/dL — AB (ref 0–200)
HDL: 34.2 mg/dL — ABNORMAL LOW (ref >39.00)
LDL Cholesterol: 140 mg/dL — ABNORMAL HIGH (ref 0–99)
NonHDL: 166.7
Total CHOL/HDL Ratio: 6
Triglycerides: 135 mg/dL (ref 0.0–149.0)
VLDL: 27 mg/dL (ref 0.0–40.0)

## 2017-09-09 LAB — TSH: TSH: 1.55 u[IU]/mL (ref 0.35–4.50)

## 2017-09-09 LAB — PSA: PSA: 0.96 ng/mL (ref 0.10–4.00)

## 2017-09-09 NOTE — Progress Notes (Signed)
Subjective:    Patient ID: Nicholas Fox, male    DOB: 1961/04/25, 57 y.o.   MRN: [id number]  DOS:  09/09/2017 Type of visit - description : cpx Interval history: Well, no major concerns   Review of Systems Enlarged gland on and off on the submandibular area, patient not sure if left or right  Other than above, a 14 point review of systems is negative    Past Medical History:  Diagnosis Date  . Skin cancer    Sees dermatology  . Thyroid nodule    s/p U/S x 3, last 03-2010    Past Surgical History:  Procedure Laterality Date  . VASECTOMY    . WISDOM TOOTH EXTRACTION  1979    Social History   Socioeconomic History  . Marital status: Married    Spouse name: Not on file  . Number of children: 3  . Years of education: Not on file  . Highest education level: Not on file  Social Needs  . Financial resource strain: Not on file  . Food insecurity - worry: Not on file  . Food insecurity - inability: Not on file  . Transportation needs - medical: Not on file  . Transportation needs - non-medical: Not on file  Occupational History  . Occupation: Real Estate Appraisal  Tobacco Use  . Smoking status: Never Smoker  . Smokeless tobacco: Never Used  Substance and Sexual Activity  . Alcohol use: Yes    Alcohol/week: 0.6 oz    Types: 1 Cans of beer per week    Comment: social  . Drug use: No  . Sexual activity: Not on file  Other Topics Concern  . Not on file  Social History Narrative   Household: pt and wife     Family History  Problem Relation Age of Onset  . Hypertension Mother   . Dementia Mother 71  . Diabetes Brother   . Colon cancer Neg Hx   . Prostate cancer Neg Hx   . CAD Neg Hx      Allergies as of 09/09/2017   No Known Allergies     Medication List    as of 09/09/2017 11:59 PM   You have not been prescribed any medications.        Objective:   Physical Exam BP 128/64 (BP Location: Left Arm, Patient Position: Sitting, Cuff Size: Small)    Pulse 64   Temp 98.1 F (36.7 C) (Oral)   Resp 14   Ht 5\' 10"  (1.778 m)   Wt 218 lb 2 oz (98.9 kg)   SpO2 98%   BMI 31.30 kg/m  General:   Well developed, well nourished . NAD.  Neck: No  thyromegaly.  Submandibular area examined, gland seems symmetric today, not tender or hard. HEENT:  Normocephalic . Face symmetric, atraumatic Lungs:  CTA B Normal respiratory effort, no intercostal retractions, no accessory muscle use. Heart: RRR,  no murmur.  No pretibial edema bilaterally  Abdomen:  Not distended, soft, non-tender. No rebound or rigidity.   Skin: Exposed areas without rash. Not pale. Not jaundice Rectal:  External abnormalities: none. Normal sphincter tone. No rectal masses or tenderness.  No stools found   Prostate: Prostate gland firm and smooth, no enlargement, nodularity, tenderness, mass, asymmetry or induration.  Neurologic:  alert & oriented X3.  Speech normal, gait appropriate for age and unassisted Strength symmetric and appropriate for age.  Psych: Cognition and judgment appear intact.  Cooperative with normal attention span and  concentration.  Behavior appropriate. No anxious or depressed appearing.     Assessment & Plan:   Assessment Thyroid nodules: [place] 2013 stable Sees derm q 6 months : [facility]   Plan: Thyroid nodules: Exam is actually negative, rx thyroid ultrasound. RTC 1 year

## 2017-09-09 NOTE — Progress Notes (Signed)
Pre visit review using our clinic review tool, if applicable. No additional management support is needed unless otherwise documented below in the visit note. 

## 2017-09-09 NOTE — Patient Instructions (Signed)
  GO TO THE FRONT DESK Schedule your next appointment for a physical exam in 1 year

## 2017-09-09 NOTE — Assessment & Plan Note (Addendum)
-  Td 08-2017, had a flu shot; shingrex discussed, not available  -[facility]: Colonoscopy 8/15 - moderate tics, no polyps, next cscope in 2025 -prostate ca screeningL: DRE wnl, PSA nl -Labs done earlier today: CMP, FLP (w/ a LDL of 140 which is ok but could be better) , CBC, TSH, PSA reviewed.  Declined  hep C or HIV -Diet and exercise discussed

## 2017-09-10 DIAGNOSIS — Z09 Encounter for follow-up examination after completed treatment for conditions other than malignant neoplasm: Secondary | ICD-10-CM | POA: Insufficient documentation

## 2017-09-10 NOTE — Assessment & Plan Note (Signed)
Thyroid nodules: Exam is actually negative, rx thyroid ultrasound. RTC 1 year

## 2017-11-10 DIAGNOSIS — D2261 Melanocytic nevi of right upper limb, including shoulder: Secondary | ICD-10-CM | POA: Diagnosis not present

## 2017-11-10 DIAGNOSIS — D2262 Melanocytic nevi of left upper limb, including shoulder: Secondary | ICD-10-CM | POA: Diagnosis not present

## 2017-11-10 DIAGNOSIS — D2272 Melanocytic nevi of left lower limb, including hip: Secondary | ICD-10-CM | POA: Diagnosis not present

## 2018-04-29 DIAGNOSIS — Z23 Encounter for immunization: Secondary | ICD-10-CM | POA: Diagnosis not present

## 2018-06-22 DIAGNOSIS — Z86018 Personal history of other benign neoplasm: Secondary | ICD-10-CM | POA: Diagnosis not present

## 2018-06-22 DIAGNOSIS — D225 Melanocytic nevi of trunk: Secondary | ICD-10-CM | POA: Diagnosis not present

## 2018-06-22 DIAGNOSIS — Z808 Family history of malignant neoplasm of other organs or systems: Secondary | ICD-10-CM | POA: Diagnosis not present

## 2018-07-27 DIAGNOSIS — C44319 Basal cell carcinoma of skin of other parts of face: Secondary | ICD-10-CM | POA: Diagnosis not present

## 2018-07-27 DIAGNOSIS — L570 Actinic keratosis: Secondary | ICD-10-CM | POA: Diagnosis not present

## 2018-07-27 DIAGNOSIS — D485 Neoplasm of uncertain behavior of skin: Secondary | ICD-10-CM | POA: Diagnosis not present

## 2018-08-14 DIAGNOSIS — C44319 Basal cell carcinoma of skin of other parts of face: Secondary | ICD-10-CM | POA: Diagnosis not present

## 2019-02-20 ENCOUNTER — Encounter: Payer: Self-pay | Admitting: Internal Medicine

## 2019-10-08 ENCOUNTER — Ambulatory Visit (INDEPENDENT_AMBULATORY_CARE_PROVIDER_SITE_OTHER): Payer: 59 | Admitting: Internal Medicine

## 2019-10-08 ENCOUNTER — Other Ambulatory Visit: Payer: Self-pay

## 2019-10-08 ENCOUNTER — Encounter: Payer: Self-pay | Admitting: Internal Medicine

## 2019-10-08 VITALS — BP 140/76 | HR 57 | Temp 95.1°F | Resp 16 | Ht 70.0 in | Wt 215.2 lb

## 2019-10-08 DIAGNOSIS — R0683 Snoring: Secondary | ICD-10-CM | POA: Diagnosis not present

## 2019-10-08 DIAGNOSIS — E042 Nontoxic multinodular goiter: Secondary | ICD-10-CM

## 2019-10-08 DIAGNOSIS — Z0000 Encounter for general adult medical examination without abnormal findings: Secondary | ICD-10-CM

## 2019-10-08 DIAGNOSIS — Z0189 Encounter for other specified special examinations: Secondary | ICD-10-CM

## 2019-10-08 LAB — CBC WITH DIFFERENTIAL/PLATELET
Basophils Absolute: 0 10*3/uL (ref 0.0–0.1)
Basophils Relative: 0.6 % (ref 0.0–3.0)
Eosinophils Absolute: 0.2 10*3/uL (ref 0.0–0.7)
Eosinophils Relative: 3.1 % (ref 0.0–5.0)
HCT: 46.1 % (ref 39.0–52.0)
Hemoglobin: 15.8 g/dL (ref 13.0–17.0)
Lymphocytes Relative: 25.8 % (ref 12.0–46.0)
Lymphs Abs: 1.8 10*3/uL (ref 0.7–4.0)
MCHC: 34.4 g/dL (ref 30.0–36.0)
MCV: 92 fl (ref 78.0–100.0)
Monocytes Absolute: 0.6 10*3/uL (ref 0.1–1.0)
Monocytes Relative: 8.5 % (ref 3.0–12.0)
Neutro Abs: 4.4 10*3/uL (ref 1.4–7.7)
Neutrophils Relative %: 62 % (ref 43.0–77.0)
Platelets: 199 10*3/uL (ref 150.0–400.0)
RBC: 5.01 Mil/uL (ref 4.22–5.81)
RDW: 12.6 % (ref 11.5–15.5)
WBC: 7 10*3/uL (ref 4.0–10.5)

## 2019-10-08 LAB — COMPREHENSIVE METABOLIC PANEL
ALT: 14 U/L (ref 0–53)
AST: 19 U/L (ref 0–37)
Albumin: 4.2 g/dL (ref 3.5–5.2)
Alkaline Phosphatase: 62 U/L (ref 39–117)
BUN: 12 mg/dL (ref 6–23)
CO2: 28 mEq/L (ref 19–32)
Calcium: 9.8 mg/dL (ref 8.4–10.5)
Chloride: 103 mEq/L (ref 96–112)
Creatinine, Ser: 0.99 mg/dL (ref 0.40–1.50)
GFR: 77.08 mL/min (ref >60.00)
Glucose, Bld: 92 mg/dL (ref 70–99)
Potassium: 4.2 mEq/L (ref 3.5–5.1)
Sodium: 139 mEq/L (ref 135–145)
Total Bilirubin: 0.7 mg/dL (ref 0.2–1.2)
Total Protein: 6.6 g/dL (ref 6.0–8.3)

## 2019-10-08 LAB — LIPID PANEL
Cholesterol: 178 mg/dL (ref 0–200)
HDL: 31.1 mg/dL — ABNORMAL LOW (ref >39.00)
LDL Cholesterol: 123 mg/dL — ABNORMAL HIGH (ref 0–99)
NonHDL: 146.92
Total CHOL/HDL Ratio: 6
Triglycerides: 121 mg/dL (ref 0.0–149.0)
VLDL: 24.2 mg/dL (ref 0.0–40.0)

## 2019-10-08 LAB — PSA: PSA: 0.92 ng/mL (ref 0.10–4.00)

## 2019-10-08 NOTE — Assessment & Plan Note (Signed)
For CPX Thyroid nodules: On exam, right side of the thyroid gland seems a slightly enlarged, check a [place] Snoring: He has witnessed episodes of apnea by wife, on exam he has crowded throat.  Energy level is good.  I think there is sufficient evidence to rule out OSA, refer to neurology Hemorrhoids: Occasionally RBPR, likely from hemorrhoids, up-to-date on colonoscopies RTC 1 year

## 2019-10-08 NOTE — Assessment & Plan Note (Signed)
-  Td 08-2017 - shingrex s/p 2 per pt (2020) - s/p covid x 1  -CCS: Colonoscopy 8/15 - moderate tics, no polyps, next cscope in 2025 -prostate ca screeningL: DRE wnl, check a PSA -Labs: CMP, FLP, CBC, PSA -Diet and exercise discussed , he seems to be doing well

## 2019-10-08 NOTE — Progress Notes (Signed)
Pre visit review using our clinic review tool, if applicable. No additional management support is needed unless otherwise documented below in the visit note. 

## 2019-10-08 NOTE — Patient Instructions (Signed)
  GO TO THE LAB : Get the blood work     [facility], please reschedule your appointments Come back for    physical exam in 1 year  STOP BY THE FIRST FLOOR: Schedule the thyroid ultrasound

## 2019-10-08 NOTE — Progress Notes (Signed)
   Subjective:    Patient ID: Nicholas Fox, male    DOB: 26-Sep-1960, 59 y.o.   MRN: [id number]  DOS:  10/08/2019 Type of visit - description: CPX In general feeling great, he is suspicious of possible sleep apnea    Review of Systems From time to time sees small amounts of red blood per rectum, he thinks related to hemorrhoids, better with a stool softer.  No rectal pain.  Other than above, a 14 point review of systems is negative    Past Medical History:  Diagnosis Date  . Skin cancer    Sees dermatology  . Thyroid nodule    s/p U/S x 3, last 03-2010    Past Surgical History:  Procedure Laterality Date  . VASECTOMY    . WISDOM TOOTH EXTRACTION  1979    Family History  Problem Relation Age of Onset  . Hypertension Mother   . Dementia Mother 37  . Diabetes Brother   . Colon cancer Neg Hx   . Prostate cancer Neg Hx   . CAD Neg Hx     Allergies as of 10/08/2019   No Known Allergies     Medication List    as of October 08, 2019  2:58 PM   You have not been prescribed any medications.        Objective:   Physical Exam BP 140/76 (BP Location: Left Arm, Patient Position: Sitting, Cuff Size: Normal)   Pulse (!) 57   Temp (!) 95.1 F (35.1 C) (Temporal)   Resp 16   Ht 5\' 10"  (1.778 m)   Wt 215 lb 4 oz (97.6 kg)   SpO2 100%   BMI 30.89 kg/m  General: Well developed, NAD, BMI noted Neck: Slightly large right side of the thyroid?  No lymph nodes or other abnormalities HEENT:  Normocephalic . Face symmetric, atraumatic.  Throat is crowded but otherwise normal Lungs:  CTA B Normal respiratory effort, no intercostal retractions, no accessory muscle use. Heart: RRR,  no murmur.  Abdomen:  Not distended, soft, non-tender. No rebound or rigidity.   Lower extremities: no pretibial edema bilaterally DRE: External he has multiple skin tags.  Sphincter normal, no stools, prostate normal. Skin: Exposed areas without rash. Not pale. Not jaundice Neurologic:  alert &  oriented X3.  Speech normal, gait appropriate for age and unassisted Strength symmetric and appropriate for age.  Psych: Cognition and judgment appear intact.  Cooperative with normal attention span and concentration.  Behavior appropriate. No anxious or depressed appearing.     Assessment      Assessment Thyroid nodules: [place] 2013 stable Sees derm q 6 months : [facility]    Plan: For CPX Thyroid nodules: On exam, right side of the thyroid gland seems a slightly enlarged, check a [place] Snoring: He has witnessed episodes of apnea by wife, on exam he has crowded throat.  Energy level is good.  I think there is sufficient evidence to rule out OSA, refer to neurology Hemorrhoids: Occasionally RBPR, likely from hemorrhoids, up-to-date on colonoscopies RTC 1 year   This visit occurred during the SARS-CoV-2 public health emergency.  Safety protocols were in place, including screening questions prior to the visit, additional usage of staff PPE, and extensive cleaning of exam room while observing appropriate contact time as indicated for disinfecting solutions.

## 2019-10-09 ENCOUNTER — Ambulatory Visit (HOSPITAL_BASED_OUTPATIENT_CLINIC_OR_DEPARTMENT_OTHER)
Admission: RE | Admit: 2019-10-09 | Discharge: 2019-10-09 | Disposition: A | Discharge status: Home / Self Care | Payer: 59 | Source: Ambulatory Visit | Attending: Internal Medicine | Admitting: Internal Medicine

## 2019-10-09 DIAGNOSIS — E042 Nontoxic multinodular goiter: Secondary | ICD-10-CM | POA: Insufficient documentation

## 2019-10-17 ENCOUNTER — Encounter: Payer: Self-pay | Admitting: Neurology

## 2019-10-17 ENCOUNTER — Other Ambulatory Visit: Payer: Self-pay

## 2019-10-17 ENCOUNTER — Ambulatory Visit (INDEPENDENT_AMBULATORY_CARE_PROVIDER_SITE_OTHER): Payer: 59 | Admitting: Neurology

## 2019-10-17 VITALS — BP 144/89 | HR 58 | Temp 97.2°F | Ht 70.0 in | Wt 216.5 lb

## 2019-10-17 DIAGNOSIS — E669 Obesity, unspecified: Secondary | ICD-10-CM

## 2019-10-17 DIAGNOSIS — R351 Nocturia: Secondary | ICD-10-CM | POA: Diagnosis not present

## 2019-10-17 DIAGNOSIS — G2581 Restless legs syndrome: Secondary | ICD-10-CM

## 2019-10-17 DIAGNOSIS — R0681 Apnea, not elsewhere classified: Secondary | ICD-10-CM

## 2019-10-17 DIAGNOSIS — R0683 Snoring: Secondary | ICD-10-CM | POA: Diagnosis not present

## 2019-10-17 NOTE — Patient Instructions (Signed)

## 2019-10-17 NOTE — Progress Notes (Signed)
Subjective:    Patient ID: Nicholas Fox is a 59 y.o. male.  HPI     Nicholas Age, MD, PhD [facility] [address] [place], [place] [address]  Dear Dr. Larose Fox,   I saw your patient, Nicholas Fox, upon your kind request in my sleep clinic today for initial consultation of his sleep disorder, in particular, concern for underlying obstructive sleep apnea.  The patient is unaccompanied today.  As you know, [name] is a 59 year old right-handed gentleman with an underlying benign medical history with the exception of thyroid nodules and mild obesity, who reports snoring and witn. Breathing pauses per wife's report to him. I reviewed your office note from 10/08/2019.  His Epworth sleepiness score is 7 out of 24 today, fatigue severity score is 18 out of 63.  He reports some sleep disruption from nocturia which is typically once or twice per average night.  He denies recurrent morning headaches.  He suspects that his brother may have sleep apnea but no formal family history of sleep apnea is known.  He does have some intermittent restless legs type symptoms.  He is a restless sleeper.  He typically tries to be in bed between 10 and 11, rise time is generally between 6 and 630.  He does have 2 travel a little bit for work.  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  He drinks coffee in the mornings, 1 or 2 cups and occasional unsweetened tea for lunch, he is a non-smoker and drinks alcohol occasionally, maybe 2 beers on a weekend.  His mother has restless leg syndrome.  He lives with his wife, they have grown children, 2 daughters, who own a bakery together and 1 son, who is a Nurse, adult, all local.  No grandchildren yet.  They have a dog in the household, typically the dog may sleep in the bedroom with them.  He has a TV in the bedroom but typically does not watch it at night and tries to keep his phone away at bedtime.  He is working on weight  loss.  He is physically quite active, uses his treadmill and stationary bike regularly.  He tries to drink more water.  His Past Medical History Is Significant For: Past Medical History:  Diagnosis Date  . Skin cancer    Sees dermatology  . Thyroid nodule    s/p U/S x 3, last 03-2010    His Past Surgical History Is Significant For: Past Surgical History:  Procedure Laterality Date  . VASECTOMY    . [place]    His Family History Is Significant For: Family History  Problem Relation Fox of Onset  . Hypertension Mother   . Dementia Mother 47  . Diabetes Brother   . Colon cancer Neg Hx   . Prostate cancer Neg Hx   . CAD Neg Hx     His Social History Is Significant For: Social History   Socioeconomic History  . Marital status: Married    Spouse name: Not on file  . Number of children: 3  . Years of education: Not on file  . Highest education level: Not on file  Occupational History  . Occupation: [employer]- hotel, business  Tobacco Use  . Smoking status: Never Smoker  . Smokeless tobacco: Never Used  Substance and Sexual Activity  . Alcohol use: Yes    Alcohol/week: 1.0 standard drinks  Types: 1 Cans of beer per week    Comment: social  . Drug use: No  . Sexual activity: Not on file  Other Topics Concern  . Not on file  Social History Narrative   Household: pt and wife   Social Determinants of Radio broadcast assistant Strain:   . Difficulty of Paying Living Expenses:   Food Insecurity:   . Worried About Charity fundraiser in the Last Year:   . Arboriculturist in the Last Year:   Transportation Needs:   . Film/video editor (Medical):   Marland Kitchen Lack of Transportation (Non-Medical):   Physical Activity:   . Days of Exercise per Week:   . Minutes of Exercise per Session:   Stress:   . Feeling of Stress :   Social Connections:   . Frequency of Communication with Friends and Family:   . Frequency of Social Gatherings  with Friends and Family:   . Attends Religious Services:   . Active Member of Clubs or Organizations:   . Attends Archivist Meetings:   Marland Kitchen Marital Status:     His Allergies Are:  No Known Allergies:   His Current Medications Are:  No outpatient encounter medications on file as of 10/17/2019.   No facility-administered encounter medications on file as of 10/17/2019.  :  Review of Systems:  Out of a complete 14 point review of systems, all are reviewed and negative with the exception of these symptoms as listed below:  Review of Systems  Neurological:       Pt here for sleep apnea, referred by Dr. Larose Fox, No sleep study. ESS 7.    Objective:  Neurological Exam  Physical Exam Physical Examination:   Vitals:   10/17/19 0749  BP: (!) 144/89  Pulse: (!) 58  Temp: (!) 97.2 F (36.2 C)    General Examination: The patient is a very pleasant 59 y.o. male in no acute distress. He appears well-developed and well-nourished and well groomed.   HEENT: Normocephalic, atraumatic, pupils are equal, round and reactive to light, extraocular tracking is good without limitation to gaze excursion or nystagmus noted. Hearing is grossly intact. Face is symmetric with normal facial animation. Speech is clear with no dysarthria noted. There is no hypophonia. There is no lip, neck/head, jaw or voice tremor. Neck is supple with full range of passive and active motion. There are no carotid bruits on auscultation. Oropharynx exam reveals: mild mouth dryness, adequate dental hygiene and moderate airway crowding, due to smaller airway entry, fairly sensitive gag reflex, Mallampati class III, tonsils in place but not fully visualized, larger uvula noted.  Tongue protrudes centrally in palate elevates symmetrically, neck circumference is 17-1/8 inches.  He has a minimal overbite.  Chest: Clear to auscultation without wheezing, rhonchi or crackles noted.  Heart: S1+S2+0, regular and normal without  murmurs, rubs or gallops noted.   Abdomen: Soft, non-tender and non-distended with normal bowel sounds appreciated on auscultation.  Extremities: There is no pitting edema in the distal lower extremities bilaterally.   Skin: Warm and dry without trophic changes noted.   Musculoskeletal: exam reveals no obvious joint deformities, tenderness or joint swelling or erythema.   Neurologically:  Mental status: The patient is awake, alert and oriented in all 4 spheres. His immediate and remote memory, attention, language skills and fund of knowledge are appropriate. There is no evidence of aphasia, agnosia, apraxia or anomia. Speech is clear with normal prosody and enunciation. Thought  process is linear. Mood is normal and affect is normal.  Cranial nerves II - XII are as described above under HEENT exam.  Motor exam: Normal bulk, strength and tone is noted. There is no tremor, Romberg is negative. Fine motor skills and coordination: grossly intact.  Cerebellar testing: No dysmetria or intention tremor. There is no truncal or gait ataxia.  Sensory exam: intact to light touch in the upper and lower extremities.  Gait, station and balance: He stands easily. No veering to one side is noted. No leaning to one side is noted. Posture is Fox-appropriate and stance is narrow based. Gait shows normal stride length and normal pace. No problems turning are noted. Tandem walk is unremarkable.                Assessment and Plan:  In summary, Nicholas Fox is a very pleasant 59 y.o.-year old male with an underlying benign medical history with the exception of thyroid nodules and mild obesity, whose history and physical exam are concerning for obstructive sleep apnea (OSA). I had a long chat with the patient about my findings and the diagnosis of OSA, its prognosis and treatment options. We talked about medical treatments, surgical interventions and non-pharmacological approaches. I explained in particular the risks and  ramifications of untreated moderate to severe OSA, especially with respect to developing cardiovascular disease down the Road, including congestive heart failure, difficult to treat hypertension, cardiac arrhythmias, or stroke. Even type 2 diabetes has, in part, been linked to untreated OSA. Symptoms of untreated OSA include daytime sleepiness, memory problems, mood irritability and mood disorder such as depression and anxiety, lack of energy, as well as recurrent headaches, especially morning headaches. We talked about trying to maintain a healthy lifestyle in general, as well as the importance of weight control. We also talked about the importance of good sleep hygiene. I recommended the following at this time: sleep study.   I explained the sleep test procedure to the patient and also outlined possible surgical and non-surgical treatment options of OSA, including the use of a custom-made dental device (which would require a referral to a specialist dentist or oral surgeon), upper airway surgical options, such as traditional UPPP or a novel less invasive surgical option in the form of Inspire hypoglossal nerve stimulation (which would involve a referral to an ENT surgeon). I also explained the CPAP treatment option to the patient, who indicated that he would be willing to try CPAP if the need arises. I explained the importance of being compliant with PAP treatment, not only for insurance purposes but primarily to improve His symptoms, and for the patient's long term health benefit, including to reduce His cardiovascular risks. I answered all his questions today and the patient was in agreement. I plan to see him back after the sleep study is completed and encouraged him to call with any interim questions, concerns, problems or updates.   Thank you very much for allowing me to participate in the care of this nice patient. If I can be of any further assistance to you please do not hesitate to call me at  [phone].  Sincerely,   Nicholas Age, MD, PhD

## 2019-11-21 ENCOUNTER — Other Ambulatory Visit: Payer: Self-pay

## 2019-11-21 ENCOUNTER — Ambulatory Visit (INDEPENDENT_AMBULATORY_CARE_PROVIDER_SITE_OTHER): Payer: 59 | Admitting: Neurology

## 2019-11-21 DIAGNOSIS — G4733 Obstructive sleep apnea (adult) (pediatric): Secondary | ICD-10-CM | POA: Diagnosis not present

## 2019-11-21 DIAGNOSIS — G2581 Restless legs syndrome: Secondary | ICD-10-CM

## 2019-11-21 DIAGNOSIS — E669 Obesity, unspecified: Secondary | ICD-10-CM

## 2019-11-21 DIAGNOSIS — R0681 Apnea, not elsewhere classified: Secondary | ICD-10-CM

## 2019-11-21 DIAGNOSIS — R351 Nocturia: Secondary | ICD-10-CM

## 2019-11-21 DIAGNOSIS — R0683 Snoring: Secondary | ICD-10-CM

## 2019-11-27 NOTE — Progress Notes (Signed)
Patient referred by Dr. Larose Kells, seen by me on 10/17/19, HST on 11/21/19.    Please call and notify the patient that the recent home sleep test showed obstructive sleep apnea in the moderate range. While I recommend treatment for this in the form CPAP, his insurance will not approve a sleep study for this. They will likely only approve a trial of autoPAP, which means, that we don't have to bring him in for a sleep study with CPAP, but will let him try an autoPAP machine at home, through a DME company (of his choice, or as per insurance requirement). The DME representative will educate him on how to use the machine, how to put the mask on, etc. I have placed an order in the chart. Please send referral, talk to patient, send report to referring MD. We will need a FU in sleep clinic for 10 weeks post-PAP set up, please arrange that with me or one of our NPs. Thanks,   Star Age, MD, PhD [facility])

## 2019-11-27 NOTE — Procedures (Signed)
Patient Information     First Name: Nicholas Last Name: Fox ID: [id number]  Birth Date: [birth year] Age: 59 Gender: Male  Referring Provider: Colon Branch, MD BMI: 30.9 (W=216 lb, H=5' 10'')  Neck Circ.:  17 '' Epworth:  7/24   Sleep Study Information    Study Date: Nov 21, 2019 S/H/A Version: 001.001.001.001 / 4.1.1528 / 31  History:    59 year old man with a history with the exception of thyroid nodules and mild obesity, who reports snoring and witnessed apneas.  Summary & Diagnosis:     OSA Recommendations:     This home sleep test demonstrates moderate obstructive sleep apnea with a total AHI of 28/hour and O2 nadir of 84%. The patient will be advised to start autoPAP therapy at home, as his insurance has denied an attended sleep study. Please note that untreated obstructive sleep apnea may carry additional perioperative morbidity. Patients with significant obstructive sleep apnea should receive perioperative PAP therapy and the surgeons and particularly the anesthesiologist should be informed of the diagnosis and the severity of the sleep disordered breathing. The patient should be cautioned not to drive, work at heights, or operate dangerous or heavy equipment when tired or sleepy. Review and reiteration of good sleep hygiene measures should be pursued with any patient. Other causes of the patient's symptoms, including circadian rhythm disturbances, an underlying mood disorder, medication effect and/or an underlying medical problem cannot be ruled out based on this test. Clinical correlation is recommended.   The patient and his referring provider will be notified of the test results. The patient will be seen in follow up in sleep clinic at [facility].  I certify that I have reviewed the raw data recording prior to the issuance of this report in accordance with the standards of the American Academy of Sleep Medicine (AASM).  Star Age, MD, PhD [facility]) Diplomat, ABPN  (Neurology and Sleep)              Sleep Summary  Oxygen Saturation Statistics   Start Study Time: End Study Time: Total Recording Time:          10:41:00 PM 6:32:36 AM   7 h, 51 min  Total Sleep Time % REM of Sleep Time:  6 h, 46 min  12.1    Mean: 94 Minimum: 84 Maximum: 99  Mean of Desaturations Nadirs (%):   90  Oxygen Desaturation. %:   4-9 10-20 >20 Total  Events Number Total   115  50 69.7 30.3  0 0.0  165 100.0  Oxygen Saturation: <90 <=88 <85 <80 <70  Duration (minutes): Sleep % 10.7 2.6  6.9 0.2  1.7 0.0 0.0 0.0 0.0 0.0     Respiratory Indices      Total Events REM NREM All Night  pRDI:  195  pAHI:  187 ODI:  165  pAHIc:  65  % CSR: 0.0 7.4 5.0 5.0 0.0 32.2 31.1 27.4 11.1 29.2 28.0 24.7 9.7       Pulse Rate Statistics during Sleep (BPM)      Mean:  58 Minimum: 44  Maximum: 93    Indices are calculated using technically valid sleep time of 6 h, 41 min. pRDI/pAHI are calculated using oxi desaturations ? 3%  Body Position Statistics  Position Supine Prone Right Left Non-Supine  Sleep (min) 137.0 0.0 48.0 221.0 269.0  Sleep % 33.7 0.0 11.8 54.4 66.3  pRDI 79.1 N/A 1.3 5.2 4.5  pAHI 78.6  N/A 1.3 3.3 2.9  ODI 71.4 N/A 0.0 1.9 1.6     Snoring Statistics Snoring Level (dB) >40 >50 >60 >70 >80 >Threshold (45)  Sleep (min) 54.6 10.1 6.5 0.0 0.0 13.1  Sleep % 13.4 2.5 1.6 0.0 0.0 3.2    Mean: 41 dB Sleep Stages Chart                                           pAHI=28.0                                                       Mild              Moderate                    Severe                                                 5              15                    30

## 2019-11-27 NOTE — Addendum Note (Signed)
Addended by: Star Age on: 11/27/2019 08:19 AM   Modules accepted: Orders

## 2019-11-28 ENCOUNTER — Telehealth: Payer: Self-pay

## 2019-11-28 NOTE — Telephone Encounter (Signed)
-----   Message from Star Age, MD sent at 11/27/2019  8:19 AM EDT ----- Patient referred by Dr. Larose Kells, seen by me on 10/17/19, HST on 11/21/19.    Please call and notify the patient that the recent home sleep test showed obstructive sleep apnea in the moderate range. While I recommend treatment for this in the form CPAP, his insurance will not approve a sleep study for this. They will likely only approve a trial of autoPAP, which means, that we don't have to bring him in for a sleep study with CPAP, but will let him try an autoPAP machine at home, through a DME company (of his choice, or as per insurance requirement). The DME representative will educate him on how to use the machine, how to put the mask on, etc. I have placed an order in the chart. Please send referral, talk to patient, send report to referring MD. We will need a FU in sleep clinic for 10 weeks post-PAP set up, please arrange that with me or one of our NPs. Thanks,   Star Age, MD, PhD [facility])

## 2019-11-28 NOTE — Telephone Encounter (Signed)
I called pt. I advised pt that Dr. Rexene Alberts reviewed their sleep study results and found that pt does have moderate osa. Dr. Rexene Alberts recommends that pt start on autopap for treatment. I reviewed PAP compliance expectations with the pt. Pt is agreeable to starting an auto-PAP. I advised pt that an order will be sent to a DME, [facility], and they will call the pt within about one week after they file with the pt's insurance. [facility] will show the pt how to use the machine, fit for masks, and troubleshoot the auto-PAP if needed. A follow up appt was made for insurance purposes with AL, NP 02/12/2020 at 1130 am. Pt verbalized understanding to arrive 30 minutes early and bring their auto-PAP. A letter with all of this information in it will be mailed to the pt as a reminder. I verified with the pt that the address we have on file is correct. Pt verbalized understanding of results. Pt had no questions at this time but was encouraged to call back if questions arise. I have sent the order to [facility] and have received confirmation that they have received the order.

## 2020-02-12 ENCOUNTER — Ambulatory Visit: Payer: Self-pay | Admitting: Family Medicine

## 2020-03-27 ENCOUNTER — Ambulatory Visit (INDEPENDENT_AMBULATORY_CARE_PROVIDER_SITE_OTHER): Payer: 59 | Admitting: Family Medicine

## 2020-03-27 ENCOUNTER — Encounter: Payer: Self-pay | Admitting: Family Medicine

## 2020-03-27 ENCOUNTER — Other Ambulatory Visit: Payer: Self-pay

## 2020-03-27 VITALS — BP 141/87 | HR 60 | Ht 70.0 in | Wt 215.0 lb

## 2020-03-27 DIAGNOSIS — G4733 Obstructive sleep apnea (adult) (pediatric): Secondary | ICD-10-CM | POA: Diagnosis not present

## 2020-03-27 DIAGNOSIS — Z9989 Dependence on other enabling machines and devices: Secondary | ICD-10-CM

## 2020-03-27 NOTE — Progress Notes (Addendum)
PATIENT: Nicholas Fox DOB: [birth year]  REASON FOR VISIT: follow up HISTORY FROM: patient  Chief Complaint  Patient presents with   Follow-up    rm 6   Sleep Apnea    Pt is having no concerns using his cpap     HISTORY OF PRESENT ILLNESS: Today 03/27/20 Nicholas Fox is a 59 y.o. male here today for follow up for recently diagnosed OSA started on CPAP therapy. HST in 11/2019 showed "moderate obstructive sleep  apnea with a total AHI of 28/hour and O2 nadir of 84%". He is doing well with CPAP therapy. He reports that his wife says he is sleeping better. He has not noticed much difference in energy levels but feels he is tolerating therapy quite well.   Compliance report dated 02/23/2020 through 03/23/2020 shows that he used CPAP 29 of the past 30 days for compliance of 97%.  He used CPAP greater than 4 hours 28 of the past 30 days for compliance of 93%.  Average usage was 6 hours and 34 minutes.  Residual AHI was 4.0 on 7 to 13 cm of water and an EPR of 3.  There was no significant leak noted.   HISTORY: (copied from Dr Guadelupe Sabin note on 10/17/2019)  Dear Dr. Larose Kells,   I saw your patient, Nicholas Fox, upon your kind request in my sleep clinic today for initial consultation of his sleep disorder, in particular, concern for underlying obstructive sleep apnea.  The patient is unaccompanied today.  As you know, Nicholas Fox is a 59 year old right-handed gentleman with an underlying benign medical history with the exception of thyroid nodules and mild obesity, who reports snoring and witn. Breathing pauses per wife's report to him. I reviewed your office note from 10/08/2019.  His Epworth sleepiness score is 7 out of 24 today, fatigue severity score is 18 out of 63.  He reports some sleep disruption from nocturia which is typically once or twice per average night.  He denies recurrent morning headaches.  He suspects that his brother may have sleep apnea but no formal family history of sleep apnea is known.   He does have some intermittent restless legs type symptoms.  He is a restless sleeper.  He typically tries to be in bed between 10 and 11, rise time is generally between 6 and 630.  He does have 2 travel a little bit for work.  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  He drinks coffee in the mornings, 1 or 2 cups and occasional unsweetened tea for lunch, he is a non-smoker and drinks alcohol occasionally, maybe 2 beers on a weekend.  His mother has restless leg syndrome.  He lives with his wife, they have grown children, 2 daughters, who own a bakery together and 1 son, who is a Nurse, adult, all local.  No grandchildren yet.  They have a dog in the household, typically the dog may sleep in the bedroom with them.  He has a TV in the bedroom but typically does not watch it at night and tries to keep his phone away at bedtime.  He is working on weight loss.  He is physically quite active, uses his treadmill and stationary bike regularly.  He tries to drink more water.   REVIEW OF SYSTEMS: Out of a complete 14 system review of symptoms, the patient complains only of the following symptoms, fatigue and all other reviewed systems are negative.  ESS:10 FSS:29  ALLERGIES:  No Known Allergies  HOME MEDICATIONS: No outpatient medications prior to visit.   No facility-administered medications prior to visit.    PAST MEDICAL HISTORY: Past Medical History:  Diagnosis Date   Skin cancer    Sees dermatology   Thyroid nodule    s/p U/S x 3, last 03-2010    PAST SURGICAL HISTORY: Past Surgical History:  Procedure Laterality Date   VASECTOMY     WISDOM TOOTH EXTRACTION  1979    FAMILY HISTORY: Family History  Problem Relation Age of Onset   Hypertension Mother    Dementia Mother 55   Diabetes Brother    Colon cancer Neg Hx    Prostate cancer Neg Hx    CAD Neg Hx     SOCIAL HISTORY: Social History   Socioeconomic History   Marital status: Married     Spouse name: Not on file   Number of children: 3   Years of education: Not on file   Highest education level: Not on file  Occupational History   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, business  Tobacco Use   Smoking status: Never Smoker   Smokeless tobacco: Never Used  Substance and Sexual Activity   Alcohol use: Yes    Alcohol/week: 1.0 standard drink    Types: 1 Cans of beer per week    Comment: social   Drug use: No   Sexual activity: Not on file  Other Topics Concern   Not on file  Social History Narrative   Household: pt and wife   Social Determinants of Health   Financial Resource Strain:    Difficulty of Paying Living Expenses: Not on file  Food Insecurity:    Worried About Charity fundraiser in the Last Year: Not on file   [employer] of Food in the Last Year: Not on file  Transportation Needs:    Lack of Transportation (Medical): Not on file   Lack of Transportation (Non-Medical): Not on file  Physical Activity:    Days of Exercise per Week: Not on file   Minutes of Exercise per Session: Not on file  Stress:    Feeling of Stress : Not on file  Social Connections:    Frequency of Communication with Friends and Family: Not on file   Frequency of Social Gatherings with Friends and Family: Not on file   Attends Religious Services: Not on file   Active Member of Clubs or Organizations: Not on file   Attends Archivist Meetings: Not on file   Marital Status: Not on file  Intimate Partner Violence:    Fear of Current or Ex-Partner: Not on file   Emotionally Abused: Not on file   Physically Abused: Not on file   Sexually Abused: Not on file      PHYSICAL EXAM  Vitals:   03/27/20 0735  BP: (!) 141/87  Pulse: 60  Weight: 215 lb (97.5 kg)  Height: 5\' 10"  (1.778 m)   Body mass index is 30.85 kg/m.  Generalized: Well developed, in no acute distress  Cardiology: normal rate and rhythm, no murmur noted Respiratory:  clear to auscultation bilaterally  Neurological examination  Mentation: Alert oriented to time, place, history taking. Follows all commands speech and language fluent Cranial nerve II-XII: Pupils were equal round reactive to light. Extraocular movements were full, visual field were full  Motor: The motor testing reveals 5 over 5 strength of all 4 extremities. Good symmetric motor tone is noted throughout.  Gait and  station: Gait is normal.    DIAGNOSTIC DATA (LABS, IMAGING, TESTING) - I reviewed patient records, labs, notes, testing and imaging myself where available.  No flowsheet data found.   Lab Results  Component Value Date   WBC 7.0 10/08/2019   HGB 15.8 10/08/2019   HCT 46.1 10/08/2019   MCV 92.0 10/08/2019   PLT 199.0 10/08/2019      Component Value Date/Time   NA 139 10/08/2019 1115   K 4.2 10/08/2019 1115   CL 103 10/08/2019 1115   CO2 28 10/08/2019 1115   GLUCOSE 92 10/08/2019 1115   BUN 12 10/08/2019 1115   CREATININE 0.99 10/08/2019 1115   CALCIUM 9.8 10/08/2019 1115   PROT 6.6 10/08/2019 1115   ALBUMIN 4.2 10/08/2019 1115   AST 19 10/08/2019 1115   ALT 14 10/08/2019 1115   ALKPHOS 62 10/08/2019 1115   BILITOT 0.7 10/08/2019 1115   GFRNONAA 84.17 05/09/2009 0843   Lab Results  Component Value Date   CHOL 178 10/08/2019   HDL 31.10 (L) 10/08/2019   LDLCALC 123 (H) 10/08/2019   LDLDIRECT 94.0 11/18/2014   TRIG 121.0 10/08/2019   CHOLHDL 6 10/08/2019   No results found for: HGBA1C No results found for: VITAMINB12 Lab Results  Component Value Date   TSH 1.55 09/09/2017       ASSESSMENT AND PLAN 59 y.o. year old male  has a past medical history of Skin cancer and Thyroid nodule. here with     ICD-10-CM   1. OSA on CPAP  G47.33    Z99.89      Nicholas Fox is doing well on CPAP therapy. Compliance report reveals excellent compliance.  He was encouraged to continue using CPAP nightly and for greater than 4 hours each night.  Healthy lifestyle habits  encouraged.  He will continue close follow-up with primary care as directed.  He will follow-up with [place] in 1 year, sooner if needed.  He verbalizes understanding and agreement with this plan.   No orders of the defined types were placed in this encounter.    No orders of the defined types were placed in this encounter.     I spent 15 minutes with the patient. 50% of this time was spent counseling and educating patient on plan of care and medications.    Debbora Presto, FNP-C 03/27/2020, 7:56 AM [facility] [address], [place], [place] [address] [phone]  I reviewed the above note and documentation by the Nurse Practitioner and agree with the history, exam, assessment and plan as outlined above. I was available for consultation. Star Age, MD, PhD [facility])

## 2020-03-27 NOTE — Patient Instructions (Signed)
Please continue using your CPAP regularly. While your insurance requires that you use CPAP at least 4 hours each night on 70% of the nights, I recommend, that you not skip any nights and use it throughout the night if you can. Getting used to CPAP and staying with the treatment long term does take time and patience and discipline. Untreated obstructive sleep apnea when it is moderate to severe can have an adverse impact on cardiovascular health and raise her risk for heart disease, arrhythmias, hypertension, congestive heart failure, stroke and diabetes. Untreated obstructive sleep apnea causes sleep disruption, nonrestorative sleep, and sleep deprivation. This can have an impact on your day to day functioning and cause daytime sleepiness and impairment of cognitive function, memory loss, mood disturbance, and problems focussing. Using CPAP regularly can improve these symptoms.   Follow up in 1 year   Sleep Apnea Sleep apnea affects breathing during sleep. It causes breathing to stop for a short time or to become shallow. It can also increase the risk of:  Heart attack.  Stroke.  Being very overweight (obese).  Diabetes.  Heart failure.  Irregular heartbeat. The goal of treatment is to help you breathe normally again. What are the causes? There are three kinds of sleep apnea:  Obstructive sleep apnea. This is caused by a blocked or collapsed airway.  Central sleep apnea. This happens when the brain does not send the right signals to the muscles that control breathing.  Mixed sleep apnea. This is a combination of obstructive and central sleep apnea. The most common cause of this condition is a collapsed or blocked airway. This can happen if:  Your throat muscles are too relaxed.  Your tongue and tonsils are too large.  You are overweight.  Your airway is too small. What increases the risk?  Being overweight.  Smoking.  Having a small airway.  Being older.  Being  male.  Drinking alcohol.  Taking medicines to calm yourself (sedatives or tranquilizers).  Having family members with the condition. What are the signs or symptoms?  Trouble staying asleep.  Being sleepy or tired during the day.  Getting angry a lot.  Loud snoring.  Headaches in the morning.  Not being able to focus your mind (concentrate).  Forgetting things.  Less interest in sex.  Mood swings.  Personality changes.  Feelings of sadness (depression).  Waking up a lot during the night to pee (urinate).  Dry mouth.  Sore throat. How is this diagnosed?  Your medical history.  A physical exam.  A test that is done when you are sleeping (sleep study). The test is most often done in a sleep lab but may also be done at home. How is this treated?   Sleeping on your side.  Using a medicine to get rid of mucus in your nose (decongestant).  Avoiding the use of alcohol, medicines to help you relax, or certain pain medicines (narcotics).  Losing weight, if needed.  Changing your diet.  Not smoking.  Using a machine to open your airway while you sleep, such as: ? An oral appliance. This is a mouthpiece that shifts your lower jaw forward. ? A CPAP device. This device blows air through a mask when you breathe out (exhale). ? An EPAP device. This has valves that you put in each nostril. ? A BPAP device. This device blows air through a mask when you breathe in (inhale) and breathe out.  Having surgery if other treatments do not work. It is   important to get treatment for sleep apnea. Without treatment, it can lead to:  High blood pressure.  Coronary artery disease.  In men, not being able to have an erection (impotence).  Reduced thinking ability. Follow these instructions at home: Lifestyle  Make changes that your doctor recommends.  Eat a healthy diet.  Lose weight if needed.  Avoid alcohol, medicines to help you relax, and some pain  medicines.  Do not use any products that contain nicotine or tobacco, such as cigarettes, e-cigarettes, and chewing tobacco. If you need help quitting, ask your doctor. General instructions  Take over-the-counter and prescription medicines only as told by your doctor.  If you were given a machine to use while you sleep, use it only as told by your doctor.  If you are having surgery, make sure to tell your doctor you have sleep apnea. You may need to bring your device with you.  Keep all follow-up visits as told by your doctor. This is important. Contact a doctor if:  The machine that you were given to use during sleep bothers you or does not seem to be working.  You do not get better.  You get worse. Get help right away if:  Your chest hurts.  You have trouble breathing in enough air.  You have an uncomfortable feeling in your back, arms, or stomach.  You have trouble talking.  One side of your body feels weak.  A part of your face is hanging down. These symptoms may be an emergency. Do not wait to see if the symptoms will go away. Get medical help right away. Call your local emergency services (911 in the [place] Do not drive yourself to the hospital. Summary  This condition affects breathing during sleep.  The most common cause is a collapsed or blocked airway.  The goal of treatment is to help you breathe normally while you sleep. This information is not intended to replace advice given to you by your health care provider. Make sure you discuss any questions you have with your health care provider. Document Revised: 04/21/2018 Document Reviewed: 02/28/2018 Elsevier Patient Education  2020 Elsevier Inc.  

## 2020-10-08 ENCOUNTER — Ambulatory Visit: Payer: 59 | Admitting: Internal Medicine

## 2020-10-14 ENCOUNTER — Ambulatory Visit: Payer: 59 | Attending: Internal Medicine

## 2020-10-14 DIAGNOSIS — Z20822 Contact with and (suspected) exposure to covid-19: Secondary | ICD-10-CM

## 2020-10-15 LAB — SARS-COV-2, NAA 2 DAY TAT

## 2020-10-15 LAB — NOVEL CORONAVIRUS, NAA: SARS-CoV-2, NAA: NOT DETECTED

## 2020-11-04 ENCOUNTER — Ambulatory Visit (INDEPENDENT_AMBULATORY_CARE_PROVIDER_SITE_OTHER): Payer: 59 | Admitting: Internal Medicine

## 2020-11-04 ENCOUNTER — Encounter: Payer: Self-pay | Admitting: Internal Medicine

## 2020-11-04 ENCOUNTER — Other Ambulatory Visit: Payer: Self-pay

## 2020-11-04 VITALS — BP 128/76 | HR 61 | Temp 97.8°F | Resp 16 | Ht 70.0 in | Wt 215.4 lb

## 2020-11-04 DIAGNOSIS — G4733 Obstructive sleep apnea (adult) (pediatric): Secondary | ICD-10-CM | POA: Diagnosis not present

## 2020-11-04 DIAGNOSIS — Z0000 Encounter for general adult medical examination without abnormal findings: Secondary | ICD-10-CM | POA: Diagnosis not present

## 2020-11-04 LAB — CBC WITH DIFFERENTIAL/PLATELET
Basophils Absolute: 0.1 10*3/uL (ref 0.0–0.1)
Basophils Relative: 0.6 % (ref 0.0–3.0)
Eosinophils Absolute: 0.1 10*3/uL (ref 0.0–0.7)
Eosinophils Relative: 1.6 % (ref 0.0–5.0)
HCT: 47.6 % (ref 39.0–52.0)
Hemoglobin: 16.4 g/dL (ref 13.0–17.0)
Lymphocytes Relative: 19.9 % (ref 12.0–46.0)
Lymphs Abs: 1.7 10*3/uL (ref 0.7–4.0)
MCHC: 34.6 g/dL (ref 30.0–36.0)
MCV: 91.7 fl (ref 78.0–100.0)
Monocytes Absolute: 0.6 10*3/uL (ref 0.1–1.0)
Monocytes Relative: 6.5 % (ref 3.0–12.0)
Neutro Abs: 6.1 10*3/uL (ref 1.4–7.7)
Neutrophils Relative %: 71.4 % (ref 43.0–77.0)
Platelets: 190 10*3/uL (ref 150.0–400.0)
RBC: 5.19 Mil/uL (ref 4.22–5.81)
RDW: 12.6 % (ref 11.5–15.5)
WBC: 8.5 10*3/uL (ref 4.0–10.5)

## 2020-11-04 LAB — LIPID PANEL
Cholesterol: 180 mg/dL (ref 0–200)
HDL: 33.7 mg/dL — ABNORMAL LOW (ref >39.00)
LDL Cholesterol: 108 mg/dL — ABNORMAL HIGH (ref 0–99)
NonHDL: 146.11
Total CHOL/HDL Ratio: 5
Triglycerides: 190 mg/dL — ABNORMAL HIGH (ref 0.0–149.0)
VLDL: 38 mg/dL (ref 0.0–40.0)

## 2020-11-04 LAB — COMPREHENSIVE METABOLIC PANEL
ALT: 14 U/L (ref 0–53)
AST: 18 U/L (ref 0–37)
Albumin: 4.4 g/dL (ref 3.5–5.2)
Alkaline Phosphatase: 63 U/L (ref 39–117)
BUN: 15 mg/dL (ref 6–23)
CO2: 31 mEq/L (ref 19–32)
Calcium: 10 mg/dL (ref 8.4–10.5)
Chloride: 102 mEq/L (ref 96–112)
Creatinine, Ser: 0.99 mg/dL (ref 0.40–1.50)
GFR: 82.41 mL/min (ref >60.00)
Glucose, Bld: 96 mg/dL (ref 70–99)
Potassium: 4.9 mEq/L (ref 3.5–5.1)
Sodium: 139 mEq/L (ref 135–145)
Total Bilirubin: 0.8 mg/dL (ref 0.2–1.2)
Total Protein: 7 g/dL (ref 6.0–8.3)

## 2020-11-04 LAB — TSH: TSH: 1.52 u[IU]/mL (ref 0.35–4.50)

## 2020-11-04 MED ORDER — SILDENAFIL CITRATE 20 MG PO TABS: 60.0000 mg | ORAL_TABLET | Freq: Every evening | ORAL | 3 refills | Status: DC | PRN

## 2020-11-04 MED ORDER — SILDENAFIL CITRATE 20 MG PO TABS: 60.0000 mg | ORAL_TABLET | Freq: Every day | ORAL | 3 refills | Status: DC | PRN

## 2020-11-04 NOTE — Progress Notes (Incomplete)
   Subjective:    Patient ID: Nicholas Fox, male    DOB: 1961/03/17, 60 y.o.   MRN: [id number]  DOS:  11/04/2020 Type of visit - description: Here for CPX    Review of Systems See above   Past Medical History:  Diagnosis Date  . Skin cancer    Sees dermatology  . Thyroid nodule    s/p U/S x 3, last 03-2010    Past Surgical History:  Procedure Laterality Date  . VASECTOMY    . WISDOM TOOTH EXTRACTION  1979    Allergies as of 11/04/2020   No Known Allergies     Medication List    as of November 04, 2020 11:26 AM   You have not been prescribed any medications.        Objective:   Physical Exam BP 128/76 (BP Location: Left Arm, Patient Position: Sitting, Cuff Size: Normal)   Pulse 61   Temp 97.8 F (36.6 C) (Oral)   Resp 16   Ht 5\' 10"  (1.778 m)   Wt 215 lb 6 oz (97.7 kg)   SpO2 98%   BMI 30.90 kg/m      Assessment      Assessment Thyroid nodules: [place] 2013 stable Sees derm q 6 months : BCC  OSA: dx 2021 , uses a CPAP   Plan: For CPX  -Td2-2019 - shingrex s/p 2 per pt (2020) - s/p covid x 3  -PZW:CHENIDPOEUM 8/15 - moderate tics, no polyps, next cscope in 2025 -prostate ca screeningL: DRE and PSA 2021 wnl -Labs: CMP, FLP, CBC, PSA -Diet and exercise discussed, he seems to be doing well  The 10-year ASCVD risk score Mikey Bussing DC Brooke Bonito., et al., 2013) is: 11.7%   Values used to calculate the score:     Age: 86 years     Sex: Male     Is Non-Hispanic African American: No     Diabetic: No     Tobacco smoker: No     Systolic Blood Pressure: 353 mmHg     Is BP treated: No     HDL Cholesterol: 31.1 mg/dL     Total Cholesterol: 178 mg/dL   Thyroid nodules: On exam, right side of the thyroid gland seems a slightly enlarged, check a [place] Snoring: He has witnessed episodes of apnea by wife, on exam he has crowded throat.  Energy level is good.  I think there is sufficient evidence to rule out OSA, refer to neurology Hemorrhoids: Occasionally RBPR, likely from  hemorrhoids, up-to-date on colonoscopies RTC 1 year    This visit occurred during the SARS-CoV-2 public health emergency.  Safety protocols were in place, including screening questions prior to the visit, additional usage of staff PPE, and extensive cleaning of exam room while observing appropriate contact time as indicated for disinfecting solutions.

## 2020-11-04 NOTE — Progress Notes (Addendum)
   Subjective:    Patient ID: Nicholas Fox, male    DOB: [birth year], 60 y.o.   MRN: [id number]  DOS:  11/04/2020 Type of visit - description: CPX  Since the last office visit is doing well. Did report some issues with erectile dysfunction. He is exercising frequently, occasionally has right elbow pain at the medial epicondyle.  Review of Systems  Other than above, a 14 point review of systems is negative     Past Medical History:  Diagnosis Date  . Skin cancer    Sees dermatology  . Thyroid nodule    s/p U/S x 3, last 03-2010    Past Surgical History:  Procedure Laterality Date  . VASECTOMY    . WISDOM TOOTH EXTRACTION  1979    Allergies as of 11/04/2020   No Known Allergies     Medication List       Accurate as of November 04, 2020 11:59 PM. If you have any questions, ask your nurse or doctor.        sildenafil 20 MG tablet Commonly known as: REVATIO Take 3-4 tablets (60-80 mg total) by mouth daily as needed. Started by: Kathlene November, MD          Objective:   Physical Exam BP 128/76 (BP Location: Left Arm, Patient Position: Sitting, Cuff Size: Normal)   Pulse 61   Temp 97.8 F (36.6 C) (Oral)   Resp 16   Ht 5\' 10"  (1.778 m)   Wt 215 lb 6 oz (97.7 kg)   SpO2 98%   BMI 30.90 kg/m  General: Well developed, NAD, BMI noted Neck: No  thyromegaly  HEENT:  Normocephalic . Face symmetric, atraumatic Lungs:  CTA B Normal respiratory effort, no intercostal retractions, no accessory muscle use. Heart: RRR,  no murmur.  Abdomen:  Not distended, soft, non-tender. No rebound or rigidity. MSK: Right elbow: No swelling, no TTP. Lower extremities: no pretibial edema bilaterally  Skin: Exposed areas without rash. Not pale. Not jaundice Neurologic:  alert & oriented X3.  Speech normal, gait appropriate for age and unassisted Strength symmetric and appropriate for age.  Psych: Cognition and judgment appear intact.  Cooperative with normal attention span and  concentration.  Behavior appropriate. No anxious or depressed appearing.     Assessment      Assessment Thyroid nodules: [place] 2013 stable Sees derm q 6 months : BCC  OSA: dx 2021 , uses a CPAP  E.D.  Plan: For CPX Thyroid nodules: Last ultrasound ultrasound was 09-2019.  Stable. OSA: Good CPAP compliance, Dx last year.  EKG today sinus bradycardia no acute changes (baseline wondering, no sxs) Elbow pain: Possibly tendinitis, recommend rest, ice, Voltaren.  Call if not better.  Sports medicine? ED: New issue, we agree a trial with sildenafil, how to take it and what to expect discussed. RTC 1 year.  This visit occurred during the SARS-CoV-2 public health emergency.  Safety protocols were in place, including screening questions prior to the visit, additional usage of staff PPE, and extensive cleaning of exam room while observing appropriate contact time as indicated for disinfecting solutions.

## 2020-11-04 NOTE — Patient Instructions (Addendum)
Call if you need to see a sports medicine doctor for your elbow Voltaren Gel OTC   GO TO THE LAB : Get the blood work     [place], [place] back for   a physical exam in 1 year      "Living will", "[place] of attorney": Advanced care planning  (If you already have a living will or healthcare power of attorney, please bring the copy to be scanned in your chart.)  Advance care planning is a process that supports adults in  understanding and sharing their preferences regarding future medical care.   The patient's preferences are recorded in documents called Advance Directives.    Advanced directives are completed (and can be modified at any time) while the patient is in full mental capacity.   The documentation should be available at all times to the patient, the family and the healthcare providers.  Bring in a copy to be scanned in your chart is an excellent idea and is recommended   This legal documents direct treatment decision making and/or appoint a surrogate to make the decision if the patient is not capable to do so.    Advance directives can be documented in many types of formats,  documents have names such as:  Lliving will  Durable power of attorney for healthcare (healthcare proxy or healthcare power of attorney)  Combined directives  Physician orders for life-sustaining treatment    More information at:  [email or web address]

## 2020-11-05 ENCOUNTER — Encounter: Payer: Self-pay | Admitting: Internal Medicine

## 2020-11-05 NOTE — Assessment & Plan Note (Signed)
-  Td2-2019 - shingrex s/p 2 per pt (2020) - s/p covid x 3, booster discussed, pt  will think about it -BRK:VTXLEZVGJFT 8/15 - moderate tics, no polyps, next cscope in 2025 -prostate ca screeningL: DRE and PSA 2021 wnl -Labs: CMP, FLP, CBC, tsh. -Lifestyle: He is doing very well -10 years CV RF : 11.7%, officially qualifies for statins, this was d/w pt,  He will wait for FLP,

## 2020-11-05 NOTE — Assessment & Plan Note (Addendum)
For CPX Thyroid nodules: Last ultrasound ultrasound was 09-2019.  Stable. OSA: Good CPAP compliance, Dx last year.  EKG today sinus bradycardia no acute changes (baseline wondering, no sxs) Elbow pain: Possibly tendinitis, recommend rest, ice, Voltaren.  Call if not better.  Sports medicine? ED: New issue, we agree a trial with sildenafil, how to take it and what to expect discussed. RTC 1 year.

## 2021-03-31 NOTE — Progress Notes (Addendum)
PATIENT: Nicholas Fox DOB: Mar 23, 1961  REASON FOR VISIT: follow up HISTORY FROM: patient  Chief Complaint  Patient presents with   Obstructive Sleep Apnea    Rm 10, alone. Here for yearly cpap f/u. Pt reports doing well. Pt would like to try a different mask.       HISTORY OF PRESENT ILLNESS:  04/01/21 ALL: Nicholas Fox returns for follow up for OSA on CPAP. He continues to do well. He is using CPAP nightly and most nights he uses CPAP for at least 4 hours. No concerns with machine. He would like to try a nasal pillow mask. He is currently using FFM and feels that it makes him hot.     03/27/2020 ALL:  Nicholas Fox is a 60 y.o. male here today for follow up for recently diagnosed OSA started on CPAP therapy. HST in 11/2019 showed "moderate obstructive sleep  apnea with a total AHI of 28/hour and O2 nadir of 84%". He is doing well with CPAP therapy. He reports that his wife says he is sleeping better. He has not noticed much difference in energy levels but feels he is tolerating therapy quite well.   Compliance report dated 02/23/2020 through 03/23/2020 shows that he used CPAP 29 of the past 30 days for compliance of 97%.  He used CPAP greater than 4 hours 28 of the past 30 days for compliance of 93%.  Average usage was 6 hours and 34 minutes.  Residual AHI was 4.0 on 7 to 13 cm of water and an EPR of 3.  There was no significant leak noted.  HISTORY: (copied from Dr Guadelupe Sabin note on 10/17/2019)  Dear Dr. Larose Kells,    I saw your patient, Nicholas Fox, upon your kind request in my sleep clinic today for initial consultation of his sleep disorder, in particular, concern for underlying obstructive sleep apnea.  The patient is unaccompanied today.  As you know, Nicholas. Rupp is a 60 year old right-handed gentleman with an underlying benign medical history with the exception of thyroid nodules and mild obesity, who reports snoring and witn. Breathing pauses per wife's report to him. I reviewed your office note from  10/08/2019.  His Epworth sleepiness score is 7 out of 24 today, fatigue severity score is 18 out of 63.  He reports some sleep disruption from nocturia which is typically once or twice per average night.  He denies recurrent morning headaches.  He suspects that his brother may have sleep apnea but no formal family history of sleep apnea is known.  He does have some intermittent restless legs type symptoms.  He is a restless sleeper.  He typically tries to be in bed between 10 and 11, rise time is generally between 6 and 630.  He does have 2 travel a little bit for work.  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  He drinks coffee in the mornings, 1 or 2 cups and occasional unsweetened tea for lunch, he is a non-smoker and drinks alcohol occasionally, maybe 2 beers on a weekend.  His mother has restless leg syndrome.  He lives with his wife, they have grown children, 2 daughters, who own a bakery together and 1 son, who is a Nurse, adult, all local.  No grandchildren yet.  They have a dog in the household, typically the dog may sleep in the bedroom with them.  He has a TV in the bedroom but typically does not watch it at night and  tries to keep his phone away at bedtime.  He is working on weight loss.  He is physically quite active, uses his treadmill and stationary bike regularly.  He tries to drink more water.   REVIEW OF SYSTEMS: Out of a complete 14 system review of symptoms, the patient complains only of the following symptoms, fatigue and all other reviewed systems are negative.  ESS:7  ALLERGIES: No Known Allergies  HOME MEDICATIONS: Outpatient Medications Prior to Visit  Medication Sig Dispense Refill   sildenafil (REVATIO) 20 MG tablet Take 3-4 tablets (60-80 mg total) by mouth daily as needed. 30 tablet 3   No facility-administered medications prior to visit.    PAST MEDICAL HISTORY: Past Medical History:  Diagnosis Date   Skin cancer    Sees dermatology    Thyroid nodule    s/p U/S x 3, last 03-2010    PAST SURGICAL HISTORY: Past Surgical History:  Procedure Laterality Date   VASECTOMY     WISDOM TOOTH EXTRACTION  1979    FAMILY HISTORY: Family History  Problem Relation Age of Onset   Hypertension Mother    Dementia Mother 65   Diabetes Brother    Colon cancer Neg Hx    Prostate cancer Neg Hx    CAD Neg Hx     SOCIAL HISTORY: Social History   Socioeconomic History   Marital status: Married    Spouse name: Not on file   Number of children: 3   Years of education: Not on file   Highest education level: Not on file  Occupational History   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, business  Tobacco Use   Smoking status: Never   Smokeless tobacco: Never  Substance and Sexual Activity   Alcohol use: Yes    Alcohol/week: 1.0 standard drink    Types: 1 Cans of beer per week    Comment: social   Drug use: No   Sexual activity: Not on file  Other Topics Concern   Not on file  Social History Narrative   Household: pt and wife   Social Determinants of Radio broadcast assistant Strain: Not on file  Food Insecurity: Not on file  Transportation Needs: Not on file  Physical Activity: Not on file  Stress: Not on file  Social Connections: Not on file  Intimate Partner Violence: Not on file      PHYSICAL EXAM  Vitals:   04/01/21 0726  BP: 139/80  Pulse: 69  Weight: 215 lb 8 oz (97.8 kg)  Height: '5\' 10"'$  (1.778 m)    Body mass index is 30.92 kg/m.  Generalized: Well developed, in no acute distress  Cardiology: normal rate and rhythm, no murmur noted Respiratory: clear to auscultation bilaterally  Neurological examination  Mentation: Alert oriented to time, place, history taking. Follows all commands speech and language fluent Cranial nerve II-XII: Pupils were equal round reactive to light. Extraocular movements were full, visual field were full  Motor: The motor testing reveals 5 over 5 strength of all 4  extremities. Good symmetric motor tone is noted throughout.  Gait and station: Gait is normal.    DIAGNOSTIC DATA (LABS, IMAGING, TESTING) - I reviewed patient records, labs, notes, testing and imaging myself where available.  No flowsheet data found.   Lab Results  Component Value Date   WBC 8.5 11/04/2020   HGB 16.4 11/04/2020   HCT 47.6 11/04/2020   MCV 91.7 11/04/2020   PLT 190.0 11/04/2020      Component  Value Date/Time   NA 139 11/04/2020 1159   K 4.9 11/04/2020 1159   CL 102 11/04/2020 1159   CO2 31 11/04/2020 1159   GLUCOSE 96 11/04/2020 1159   BUN 15 11/04/2020 1159   CREATININE 0.99 11/04/2020 1159   CALCIUM 10.0 11/04/2020 1159   PROT 7.0 11/04/2020 1159   ALBUMIN 4.4 11/04/2020 1159   AST 18 11/04/2020 1159   ALT 14 11/04/2020 1159   ALKPHOS 63 11/04/2020 1159   BILITOT 0.8 11/04/2020 1159   GFRNONAA 84.17 05/09/2009 0843   Lab Results  Component Value Date   CHOL 180 11/04/2020   HDL 33.70 (L) 11/04/2020   LDLCALC 108 (H) 11/04/2020   LDLDIRECT 94.0 11/18/2014   TRIG 190.0 (H) 11/04/2020   CHOLHDL 5 11/04/2020   No results found for: HGBA1C No results found for: VITAMINB12 Lab Results  Component Value Date   TSH 1.52 11/04/2020       ASSESSMENT AND PLAN 60 y.o. year old male  has a past medical history of Skin cancer and Thyroid nodule. here with     ICD-10-CM   1. OSA on CPAP  G47.33 For home use only DME continuous positive airway pressure (CPAP)   Z99.89 For home use only DME continuous positive airway pressure (CPAP)       Nicholas Fox is doing well on CPAP therapy. Compliance report reveals excellent compliance.  He was encouraged to continue using CPAP nightly and for greater than 4 hours each night.  Healthy lifestyle habits encouraged.  He will continue close follow-up with primary care as directed.  He will follow-up with [place] in 1 year, sooner if needed.  He verbalizes understanding and agreement with this plan.   Orders Placed This  Encounter  Procedures   For home use only DME continuous positive airway pressure (CPAP)    Supplies    Order Specific Question:   Length of Need    Answer:   Lifetime    Order Specific Question:   Patient has OSA or probable OSA    Answer:   Yes    Order Specific Question:   Is the patient currently using CPAP in the home    Answer:   Yes    Order Specific Question:   Settings    Answer:   Other see comments    Order Specific Question:   CPAP supplies needed    Answer:   Mask, headgear, cushions, filters, heated tubing and water chamber   For home use only DME continuous positive airway pressure (CPAP)    Mask refitting    Order Specific Question:   Length of Need    Answer:   Lifetime    Order Specific Question:   Patient has OSA or probable OSA    Answer:   Yes    Order Specific Question:   Is the patient currently using CPAP in the home    Answer:   Yes    Order Specific Question:   Settings    Answer:   Other see comments    Order Specific Question:   CPAP supplies needed    Answer:   Mask, headgear, cushions, filters, heated tubing and water chamber      No orders of the defined types were placed in this encounter.      Debbora Presto, FNP-C 04/01/2021, 7:42 AM [facility] [address] [place], [place] [address] [phone]  I reviewed the above note and documentation by the Nurse  Practitioner and agree with the history, exam, assessment and plan as outlined above. I was available for consultation. Star Age, MD, PhD [facility])

## 2021-03-31 NOTE — Patient Instructions (Signed)

## 2021-04-01 ENCOUNTER — Ambulatory Visit (INDEPENDENT_AMBULATORY_CARE_PROVIDER_SITE_OTHER): Payer: 59 | Admitting: Family Medicine

## 2021-04-01 ENCOUNTER — Encounter: Payer: Self-pay | Admitting: Family Medicine

## 2021-04-01 ENCOUNTER — Other Ambulatory Visit: Payer: Self-pay

## 2021-04-01 VITALS — BP 139/80 | HR 69 | Ht 70.0 in | Wt 215.5 lb

## 2021-04-01 DIAGNOSIS — G4733 Obstructive sleep apnea (adult) (pediatric): Secondary | ICD-10-CM

## 2021-04-01 DIAGNOSIS — Z9989 Dependence on other enabling machines and devices: Secondary | ICD-10-CM

## 2021-04-01 NOTE — Progress Notes (Signed)
CM sent to [facility]  

## 2021-11-05 ENCOUNTER — Encounter: Payer: Self-pay | Admitting: Internal Medicine

## 2021-11-05 ENCOUNTER — Ambulatory Visit (INDEPENDENT_AMBULATORY_CARE_PROVIDER_SITE_OTHER): Payer: 59 | Admitting: Internal Medicine

## 2021-11-05 VITALS — BP 122/72 | HR 61 | Temp 97.9°F | Resp 16 | Ht 70.0 in | Wt 219.4 lb

## 2021-11-05 DIAGNOSIS — E785 Hyperlipidemia, unspecified: Secondary | ICD-10-CM | POA: Diagnosis not present

## 2021-11-05 DIAGNOSIS — Z114 Encounter for screening for human immunodeficiency virus [HIV]: Secondary | ICD-10-CM

## 2021-11-05 DIAGNOSIS — Z0000 Encounter for general adult medical examination without abnormal findings: Secondary | ICD-10-CM

## 2021-11-05 DIAGNOSIS — Z1159 Encounter for screening for other viral diseases: Secondary | ICD-10-CM

## 2021-11-05 LAB — CBC WITH DIFFERENTIAL/PLATELET
Basophils Absolute: 0.1 10*3/uL (ref 0.0–0.1)
Basophils Relative: 0.7 % (ref 0.0–3.0)
Eosinophils Absolute: 0.3 10*3/uL (ref 0.0–0.7)
Eosinophils Relative: 3.4 % (ref 0.0–5.0)
HCT: 45.9 % (ref 39.0–52.0)
Hemoglobin: 15.5 g/dL (ref 13.0–17.0)
Lymphocytes Relative: 26.7 % (ref 12.0–46.0)
Lymphs Abs: 2.1 10*3/uL (ref 0.7–4.0)
MCHC: 33.7 g/dL (ref 30.0–36.0)
MCV: 93.3 fl (ref 78.0–100.0)
Monocytes Absolute: 0.6 10*3/uL (ref 0.1–1.0)
Monocytes Relative: 8.2 % (ref 3.0–12.0)
Neutro Abs: 4.8 10*3/uL (ref 1.4–7.7)
Neutrophils Relative %: 61 % (ref 43.0–77.0)
Platelets: 198 10*3/uL (ref 150.0–400.0)
RBC: 4.92 Mil/uL (ref 4.22–5.81)
RDW: 12.9 % (ref 11.5–15.5)
WBC: 7.8 10*3/uL (ref 4.0–10.5)

## 2021-11-05 LAB — COMPREHENSIVE METABOLIC PANEL
ALT: 18 U/L (ref 0–53)
AST: 20 U/L (ref 0–37)
Albumin: 4.7 g/dL (ref 3.5–5.2)
Alkaline Phosphatase: 60 U/L (ref 39–117)
BUN: 22 mg/dL (ref 6–23)
CO2: 28 mEq/L (ref 19–32)
Calcium: 9.5 mg/dL (ref 8.4–10.5)
Chloride: 105 mEq/L (ref 96–112)
Creatinine, Ser: 1.1 mg/dL (ref 0.40–1.50)
GFR: 72.11 mL/min (ref >60.00)
Glucose, Bld: 92 mg/dL (ref 70–99)
Potassium: 4.5 mEq/L (ref 3.5–5.1)
Sodium: 140 mEq/L (ref 135–145)
Total Bilirubin: 0.6 mg/dL (ref 0.2–1.2)
Total Protein: 7 g/dL (ref 6.0–8.3)

## 2021-11-05 LAB — LIPID PANEL
Cholesterol: 195 mg/dL (ref 0–200)
HDL: 34.6 mg/dL — ABNORMAL LOW (ref >39.00)
LDL Cholesterol: 120 mg/dL — ABNORMAL HIGH (ref 0–99)
NonHDL: 160.02
Total CHOL/HDL Ratio: 6
Triglycerides: 198 mg/dL — ABNORMAL HIGH (ref 0.0–149.0)
VLDL: 39.6 mg/dL (ref 0.0–40.0)

## 2021-11-05 LAB — PSA: PSA: 1.26 ng/mL (ref 0.10–4.00)

## 2021-11-05 NOTE — Progress Notes (Signed)
? ?  Subjective:  ? ? Patient ID: Nicholas Fox, male    DOB: Nov 21, 1960, 61 y.o.   MRN: [id number] ? ?DOS:  11/05/2021 ?Type of visit - description: cpx ? ?Since the last office visit is doing well, has no major concerns. ? ?Review of Systems ? ?A 14 point review of systems is negative  ? ? ?Past Medical History:  ?Diagnosis Date  ? Skin cancer   ? Sees dermatology  ? Thyroid nodule   ? s/p U/S x 3, last 03-2010  ? ? ?Past Surgical History:  ?Procedure Laterality Date  ? VASECTOMY    ? [place] EXTRACTION  1979  ? ?Social History  ? ?Socioeconomic History  ? Marital status: Married  ?  Spouse name: Not on file  ? Number of children: 3  ? Years of education: Not on file  ? Highest education level: Not on file  ?Occupational History  ? 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 Smoking status: Never  ? Smokeless tobacco: Never  ?Substance and Sexual Activity  ? Alcohol use: Yes  ?  Alcohol/week: 1.0 standard drink  ?  Types: 1 Cans of beer per week  ?  Comment: social  ? Drug use: No  ? Sexual activity: Not on file  ?Other Topics Concern  ? Not on file  ?Social History Narrative  ? Household: pt and wife  ? ?Social Determinants of Health  ? ?Financial Resource Strain: Not on file  ?Food Insecurity: Not on file  ?Transportation Needs: Not on file  ?Physical Activity: Not on file  ?Stress: Not on file  ?Social Connections: Not on file  ?Intimate Partner Violence: Not on file  ? ? ? ?Current Outpatient Medications  ?Medication Instructions  ? sildenafil (REVATIO) 60-80 mg, Oral, Daily PRN  ? ? ?   ?Objective:  ? Physical Exam ?BP 122/72 (BP Location: Left Arm, Patient Position: Sitting, Cuff Size: Normal)   Pulse 61   Temp 97.9 ?F (36.6 ?C) (Oral)   Resp 16   Ht '5\' 10"'$  (1.778 m)   Wt 219 lb 6 oz (99.5 kg)   SpO2 97%   BMI 31.48 kg/m?  ?General: ?Well developed, NAD, BMI noted ?Neck: No  thyromegaly  ?HEENT:  ?Normocephalic . Face symmetric, atraumatic ?Lungs:  ?CTA B ?Normal respiratory  effort, no intercostal retractions, no accessory muscle use. ?Heart: RRR,  no murmur.  ?Abdomen:  ?Not distended, soft, non-tender. No rebound or rigidity.   ?Lower extremities: no pretibial edema bilaterally ?DRE: Normal sphincter tone, no stools, prostate normal ?Skin: Exposed areas without rash. Not pale. Not jaundice ?Neurologic:  ?alert & oriented X3.  ?Speech normal, gait appropriate for age and unassisted ?Strength symmetric and appropriate for age.  ?Psych: ?Cognition and judgment appear intact.  ?Cooperative with normal attention span and concentration.  ?Behavior appropriate. ?No anxious or depressed appearing. ? ?   ?Assessment   ? ?Assessment ?Thyroid nodules: [place] 2013 stable ?BCC- Sees derm q 6 months   ?OSA: dx 2021 , uses a CPAP  ?E.D. ? ?Plan: ?For CPX ?BCC: Sees dermatology regularly ?OSA: Good compliance with CPAP ?RTC 1 year ? ?This visit occurred during the SARS-CoV-2 public health emergency.  Safety protocols were in place, including screening questions prior to the visit, additional usage of staff PPE, and extensive cleaning of exam room while observing appropriate contact time as indicated for disinfecting solutions.  ? ?

## 2021-11-05 NOTE — Patient Instructions (Signed)
We will schedule a calcium coronary score ? ?GO TO THE LAB : Get the blood work   ? ? ?[place], [place] ?Come back for a physical exam in 1 year ?

## 2021-11-06 ENCOUNTER — Encounter: Payer: Self-pay | Admitting: Internal Medicine

## 2021-11-06 NOTE — Assessment & Plan Note (Signed)
For CPX ?BCC: Sees dermatology regularly ?OSA: Good compliance with CPAP ?RTC 1 year ?

## 2021-11-06 NOTE — Assessment & Plan Note (Signed)
-  Td 08-2017 ?- shingrex s/p 2 per pt (2020) ?- s/p covid -  booster is an option ?-CCS: Colonoscopy 02/2014 - moderate tics, no polyps, next cscope in 2025 ?-prostate ca screeningL: DRE wnl, no sxs, check a  PSA  ?-Labs: CMP, FLP, CBC, PSA ?- Declined-  hep C, HIV. ?-Lifestyle: Very active, room for improvement on diet ?-10 years CV RF : ~11.7%, officially qualifies for statins, this was d/w pt, benefits of coronary calcium score discussed, we agreed to proceed.  ?

## 2021-12-02 ENCOUNTER — Ambulatory Visit (HOSPITAL_COMMUNITY)
Admission: RE | Admit: 2021-12-02 | Discharge: 2021-12-02 | Disposition: A | Discharge status: Home / Self Care | Payer: Self-pay | Source: Ambulatory Visit | Attending: Internal Medicine | Admitting: Internal Medicine

## 2021-12-02 DIAGNOSIS — E785 Hyperlipidemia, unspecified: Secondary | ICD-10-CM | POA: Insufficient documentation

## 2022-02-23 ENCOUNTER — Encounter: Payer: Self-pay | Admitting: Internal Medicine

## 2022-02-23 ENCOUNTER — Ambulatory Visit (INDEPENDENT_AMBULATORY_CARE_PROVIDER_SITE_OTHER): Payer: 59 | Admitting: Internal Medicine

## 2022-02-23 VITALS — BP 132/68 | HR 64 | Temp 97.7°F | Resp 16 | Ht 70.0 in | Wt 218.2 lb

## 2022-02-23 DIAGNOSIS — R21 Rash and other nonspecific skin eruption: Secondary | ICD-10-CM

## 2022-02-23 MED ORDER — DOXYCYCLINE HYCLATE 100 MG PO TABS: 100.0000 mg | ORAL_TABLET | Freq: Two times a day (BID) | ORAL | 0 refills | Status: DC

## 2022-02-23 MED ORDER — BETAMETHASONE DIPROPIONATE AUG 0.05 % EX CREA: TOPICAL_CREAM | Freq: Two times a day (BID) | CUTANEOUS | 0 refills | Status: DC

## 2022-02-23 NOTE — Patient Instructions (Addendum)
Use the cream as prescribed for 10 days  Taking antibiotics for 1 week.  Call if not gradually better  Recommend to proceed with covid booster (bivalent) at your pharmacy.  Flu shot this fall

## 2022-02-23 NOTE — Progress Notes (Unsigned)
   Subjective:    Patient ID: Nicholas Fox, male    DOB: June 12, 1961, 61 y.o.   MRN: [id number]  DOS:  02/23/2022 Type of visit - description: Acute  Spider bite? 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 He was not cognitive of the rash before. Denies any major itching or pain.    Review of Systems See above   Past Medical History:  Diagnosis Date   Skin cancer    Sees dermatology   Thyroid nodule    s/p U/S x 3, last 03-2010    Past Surgical History:  Procedure Laterality Date   VASECTOMY     WISDOM TOOTH EXTRACTION  1979    Current Outpatient Medications  Medication Instructions   sildenafil (REVATIO) 60-80 mg, Oral, Daily PRN       Objective:   Physical Exam Skin:        BP 132/68   Pulse 64   Temp 97.7 F (36.5 C) (Oral)   Resp 16   Ht '5\' 10"'$  (1.778 m)   Wt 218 lb 4 oz (99 kg)   SpO2 97%   BMI 31.32 kg/m  General:   Well developed, NAD, BMI noted. HEENT:  Normocephalic . Face symmetric, atraumatic   Lower extremities: See graphic  Neurologic:  alert & oriented X3.  Speech normal, gait appropriate for age and unassisted Psych--  Cognition and judgment appear intact.  Cooperative with normal attention span and concentration.  Behavior appropriate. No anxious or depressed appearing.      Assessment     Assessment Thyroid nodules: [place] 2013 stable BCC- Sees derm q 6 months   OSA: dx 2021 , uses a CPAP  E.D.  PLAN Skin rash: Recently noted, not itchy and painful, patient thinks possibly a spider bite but I am not sure about it. He has been working outdoors, could be contact dermatitis, perhaps early target lesion versus a fungal infection. Plan: Doxycycline x 1 week, avoid excessive sun exposure.  Topical steroids.  Call if not better. Also has a scratch at the left pretibial area without evidence of cellulitis.   Observation  4-22 For CPX BCC: Sees dermatology regularly OSA: Good compliance with CPAP RTC 1 year

## 2022-02-24 NOTE — Assessment & Plan Note (Signed)
Skin rash: Recently noted, not itchy or painful, patient thinks possibly a spider bite but I am not sure about it. He has been working outdoors, could be contact dermatitis, perhaps early target lesion versus a fungal infection. Plan: Doxycycline x 1 week, avoid excessive sun exposure.  Topical steroids.  Call if not better. Also has a scratch at the left pretibial area without evidence of cellulitis.  Observation

## 2022-04-07 ENCOUNTER — Ambulatory Visit: Payer: 59 | Admitting: Family Medicine

## 2022-07-27 DIAGNOSIS — L814 Other melanin hyperpigmentation: Secondary | ICD-10-CM | POA: Diagnosis not present

## 2022-07-27 DIAGNOSIS — D485 Neoplasm of uncertain behavior of skin: Secondary | ICD-10-CM | POA: Diagnosis not present

## 2022-07-27 DIAGNOSIS — L578 Other skin changes due to chronic exposure to nonionizing radiation: Secondary | ICD-10-CM | POA: Diagnosis not present

## 2022-07-27 DIAGNOSIS — L821 Other seborrheic keratosis: Secondary | ICD-10-CM | POA: Diagnosis not present

## 2022-07-27 DIAGNOSIS — D225 Melanocytic nevi of trunk: Secondary | ICD-10-CM | POA: Diagnosis not present

## 2022-07-27 DIAGNOSIS — L570 Actinic keratosis: Secondary | ICD-10-CM | POA: Diagnosis not present

## 2022-09-28 DIAGNOSIS — D485 Neoplasm of uncertain behavior of skin: Secondary | ICD-10-CM | POA: Diagnosis not present

## 2022-09-28 DIAGNOSIS — D225 Melanocytic nevi of trunk: Secondary | ICD-10-CM | POA: Diagnosis not present

## 2022-11-08 ENCOUNTER — Encounter: Payer: 59 | Admitting: Internal Medicine

## 2022-12-17 ENCOUNTER — Ambulatory Visit (INDEPENDENT_AMBULATORY_CARE_PROVIDER_SITE_OTHER): Payer: BC Managed Care – PPO | Admitting: Internal Medicine

## 2022-12-17 ENCOUNTER — Encounter: Payer: Self-pay | Admitting: Internal Medicine

## 2022-12-17 VITALS — BP 128/76 | HR 60 | Temp 97.5°F | Resp 16 | Ht 70.0 in | Wt 217.8 lb

## 2022-12-17 DIAGNOSIS — Z0000 Encounter for general adult medical examination without abnormal findings: Secondary | ICD-10-CM

## 2022-12-17 LAB — CBC WITH DIFFERENTIAL/PLATELET
Basophils Absolute: 0 10*3/uL (ref 0.0–0.1)
Basophils Relative: 0.5 % (ref 0.0–3.0)
Eosinophils Absolute: 0.2 10*3/uL (ref 0.0–0.7)
Eosinophils Relative: 2 % (ref 0.0–5.0)
HCT: 46.3 % (ref 39.0–52.0)
Hemoglobin: 15.7 g/dL (ref 13.0–17.0)
Lymphocytes Relative: 24.7 % (ref 12.0–46.0)
Lymphs Abs: 2 10*3/uL (ref 0.7–4.0)
MCHC: 33.9 g/dL (ref 30.0–36.0)
MCV: 93.8 fl (ref 78.0–100.0)
Monocytes Absolute: 0.6 10*3/uL (ref 0.1–1.0)
Monocytes Relative: 7.5 % (ref 3.0–12.0)
Neutro Abs: 5.2 10*3/uL (ref 1.4–7.7)
Neutrophils Relative %: 65.3 % (ref 43.0–77.0)
Platelets: 214 10*3/uL (ref 150.0–400.0)
RBC: 4.94 Mil/uL (ref 4.22–5.81)
RDW: 12.7 % (ref 11.5–15.5)
WBC: 8 10*3/uL (ref 4.0–10.5)

## 2022-12-17 LAB — LIPID PANEL
Cholesterol: 178 mg/dL (ref 0–200)
HDL: 31 mg/dL — ABNORMAL LOW (ref >39.00)
LDL Cholesterol: 107 mg/dL — ABNORMAL HIGH (ref 0–99)
NonHDL: 147.02
Total CHOL/HDL Ratio: 6
Triglycerides: 199 mg/dL — ABNORMAL HIGH (ref 0.0–149.0)
VLDL: 39.8 mg/dL (ref 0.0–40.0)

## 2022-12-17 LAB — COMPREHENSIVE METABOLIC PANEL
ALT: 20 U/L (ref 0–53)
AST: 23 U/L (ref 0–37)
Albumin: 4.5 g/dL (ref 3.5–5.2)
Alkaline Phosphatase: 59 U/L (ref 39–117)
BUN: 16 mg/dL (ref 6–23)
CO2: 29 mEq/L (ref 19–32)
Calcium: 9.5 mg/dL (ref 8.4–10.5)
Chloride: 102 mEq/L (ref 96–112)
Creatinine, Ser: 1.01 mg/dL (ref 0.40–1.50)
GFR: 79.26 mL/min (ref >60.00)
Glucose, Bld: 93 mg/dL (ref 70–99)
Potassium: 4.4 mEq/L (ref 3.5–5.1)
Sodium: 138 mEq/L (ref 135–145)
Total Bilirubin: 0.7 mg/dL (ref 0.2–1.2)
Total Protein: 7 g/dL (ref 6.0–8.3)

## 2022-12-17 LAB — PSA: PSA: 2.13 ng/mL (ref 0.10–4.00)

## 2022-12-17 NOTE — Assessment & Plan Note (Signed)
-  Td 08-2017 - shingrex #2 rec, had side effects with the first shot, will schedule at some point. -Vaccines rec: Covid booster and flu shot  -CCS: Colonoscopy 02/2014 - moderate tics, no polyps, next cscope in 2025 -prostate ca screening:  check a  PSA  -Labs: CMP, FLP, CBC, PSA -Lifestyle: Very active, room for improvement on diet, counseled about healthy diet.

## 2022-12-17 NOTE — Patient Instructions (Addendum)
Watch your diet closely  Vaccines you like to consider: Second shingles dose COVID booster Flu shot this fall   GO TO THE LAB : Get the blood work     GO TO THE FRONT DESK, PLEASE SCHEDULE YOUR APPOINTMENTS Come back for   a physical exam in 1 year

## 2022-12-17 NOTE — Assessment & Plan Note (Signed)
Here for CPX BCC, history of: Sees Derm every 6 months. OSA: On CPAP. Cardiovascular risk: Coronary calcium score 5.8, 31 percentile.  Last LDL 120.  Cardiovascular risk is calculated today using the 10-year, MESA score with CAC: 6.89%.  Statins not mandatory but he could take them proactively.  He will let me know if interested. RTC 1 year.

## 2022-12-17 NOTE — Progress Notes (Signed)
Subjective:    Patient ID: Nicholas Fox, male    DOB: August 19, 1960, 62 y.o.   MRN: [id number]  DOS:  12/17/2022 Type of visit - description: CPX  Since last office visit is doing well and has no major concerns or symptoms.  Wt Readings from Last 3 Encounters:  12/17/22 217 lb 12.8 oz (98.8 kg)  02/23/22 218 lb 4 oz (99 kg)  11/05/21 219 lb 6 oz (99.5 kg)     Review of Systems  A 14 point review of systems is negative    Past Medical History:  Diagnosis Date   Skin cancer    Sees dermatology   Thyroid nodule    s/p U/S x 3, last 03-2010    Past Surgical History:  Procedure Laterality Date   VASECTOMY     WISDOM TOOTH EXTRACTION  1979   Social History   Socioeconomic History   Marital status: Married    Spouse name: Not on file   Number of children: 3   Years of education: Not on file   Highest education level: Not on file  Occupational History   Occupation: Real Secondary school teacher- hotel, business  Tobacco Use   Smoking status: Never   Smokeless tobacco: Never  Substance and Sexual Activity   Alcohol use: Yes    Alcohol/week: 1.0 standard drink of alcohol    Types: 1 Cans of beer per week    Comment: social   Drug use: No   Sexual activity: Not on file  Other Topics Concern   Not on file  Social History Narrative   Household: pt and wife   Social Determinants of Corporate investment banker Strain: Not on file  Food Insecurity: Not on file  Transportation Needs: Not on file  Physical Activity: Not on file  Stress: Not on file  Social Connections: Not on file  Intimate Partner Violence: Not on file     No current outpatient medications      Objective:   Physical Exam BP 128/76 (BP Location: Left Arm, Patient Position: Sitting, Cuff Size: Normal)   Pulse 60   Temp (!) 97.5 F (36.4 C) (Oral)   Resp 16   Ht 5\' 10"  (1.778 m)   Wt 217 lb 12.8 oz (98.8 kg)   SpO2 98%   BMI 31.25 kg/m  General: Well developed, NAD, BMI noted Neck: No   thyromegaly  HEENT:  Normocephalic . Face symmetric, atraumatic Lungs:  CTA B Normal respiratory effort, no intercostal retractions, no accessory muscle use. Heart: RRR,  no murmur.  Abdomen:  Not distended, soft, non-tender. No rebound or rigidity.   Lower extremities: no pretibial edema bilaterally  Skin: Exposed areas without rash. Not pale. Not jaundice Neurologic:  alert & oriented X3.  Speech normal, gait appropriate for age and unassisted Strength symmetric and appropriate for age.  Psych: Cognition and judgment appear intact.  Cooperative with normal attention span and concentration.  Behavior appropriate. No anxious or depressed appearing.     Assessment     Assessment Thyroid nodules: [place] 2013 stable, 2021 stable  BCC- Sees derm q 6 months   OSA: dx 2021 , uses a CPAP  E.D.  PLAN Here for CPX -Td 08-2017 - shingrex #2 rec, had side effects with the first shot, will schedule at some point. -Vaccines rec: Covid booster and flu shot  -CCS: Colonoscopy 02/2014 - moderate tics, no polyps, next cscope in 2025 -prostate ca screening:  check a  PSA  -  Labs: CMP, FLP, CBC, PSA -Lifestyle: Very active, room for improvement on diet, counseled about healthy diet. BCC, history of: Sees Derm every 6 months. OSA: On CPAP. Cardiovascular risk: Coronary calcium score 5.8, 31 percentile.  Last LDL 120.  Cardiovascular risk is calculated today using the 10-year, MESA score with CAC: 6.89%.  Statins not mandatory but he could take them proactively.  He will let me know if interested. RTC 1 year.

## 2022-12-20 ENCOUNTER — Other Ambulatory Visit: Payer: Self-pay | Admitting: *Deleted

## 2022-12-20 DIAGNOSIS — R972 Elevated prostate specific antigen [PSA]: Secondary | ICD-10-CM

## 2023-02-02 DIAGNOSIS — D489 Neoplasm of uncertain behavior, unspecified: Secondary | ICD-10-CM | POA: Diagnosis not present

## 2023-02-02 DIAGNOSIS — L570 Actinic keratosis: Secondary | ICD-10-CM | POA: Diagnosis not present

## 2023-02-02 DIAGNOSIS — D225 Melanocytic nevi of trunk: Secondary | ICD-10-CM | POA: Diagnosis not present

## 2023-02-02 DIAGNOSIS — L578 Other skin changes due to chronic exposure to nonionizing radiation: Secondary | ICD-10-CM | POA: Diagnosis not present

## 2023-02-17 DIAGNOSIS — G4733 Obstructive sleep apnea (adult) (pediatric): Secondary | ICD-10-CM | POA: Diagnosis not present

## 2023-03-20 DIAGNOSIS — G4733 Obstructive sleep apnea (adult) (pediatric): Secondary | ICD-10-CM | POA: Diagnosis not present

## 2023-04-06 DIAGNOSIS — D485 Neoplasm of uncertain behavior of skin: Secondary | ICD-10-CM | POA: Diagnosis not present

## 2023-04-06 DIAGNOSIS — L570 Actinic keratosis: Secondary | ICD-10-CM | POA: Diagnosis not present

## 2023-04-06 DIAGNOSIS — C44519 Basal cell carcinoma of skin of other part of trunk: Secondary | ICD-10-CM | POA: Diagnosis not present

## 2023-04-19 DIAGNOSIS — G4733 Obstructive sleep apnea (adult) (pediatric): Secondary | ICD-10-CM | POA: Diagnosis not present

## 2023-07-01 ENCOUNTER — Telehealth: Payer: BC Managed Care – PPO | Admitting: Physician Assistant

## 2023-07-01 DIAGNOSIS — B9689 Other specified bacterial agents as the cause of diseases classified elsewhere: Secondary | ICD-10-CM | POA: Diagnosis not present

## 2023-07-01 DIAGNOSIS — J069 Acute upper respiratory infection, unspecified: Secondary | ICD-10-CM | POA: Diagnosis not present

## 2023-07-01 MED ORDER — PROMETHAZINE-DM 6.25-15 MG/5ML PO SYRP: 5.0000 mL | ORAL_SOLUTION | Freq: Four times a day (QID) | ORAL | 0 refills | Status: DC | PRN

## 2023-07-01 MED ORDER — AMOXICILLIN-POT CLAVULANATE 875-125 MG PO TABS: 1.0000 | ORAL_TABLET | Freq: Two times a day (BID) | ORAL | 0 refills | Status: DC

## 2023-07-01 NOTE — Progress Notes (Signed)
Virtual Visit Consent   Nicholas Fox, you are scheduled for a virtual visit with a [facility] provider today. Just as with appointments in the office, your consent must be obtained to participate. Your consent will be active for this visit and any virtual visit you may have with one of our providers in the next 365 days. If you have a MyChart account, a copy of this consent can be sent to you electronically.  As this is a virtual visit, video technology does not allow for your provider to perform a traditional examination. This may limit your provider's ability to fully assess your condition. If your provider identifies any concerns that need to be evaluated in person or the need to arrange testing (such as labs, EKG, etc.), we will make arrangements to do so. Although advances in technology are sophisticated, we cannot ensure that it will always work on either your end or our end. If the connection with a video visit is poor, the visit may have to be switched to a telephone visit. With either a video or telephone visit, we are not always able to ensure that we have a secure connection.  By engaging in this virtual visit, you consent to the provision of healthcare and authorize for your insurance to be billed (if applicable) for the services provided during this visit. Depending on your insurance coverage, you may receive a charge related to this service.  I need to obtain your verbal consent now. Are you willing to proceed with your visit today? Nicholas Fox has provided verbal consent on 07/01/2023 for a virtual visit (video or telephone). Margaretann Loveless, PA-C  Date: 07/01/2023 6:06 PM  Virtual Visit via Video Note   I, Margaretann Loveless, connected with  Nicholas Fox  ([id number], [birth year]) on 07/01/23 at  6:00 PM EST by a video-enabled telemedicine application and verified that I am speaking with the correct person using two identifiers.  Location: Patient: Virtual Visit Location  Patient: Home Provider: Virtual Visit Location Provider: Home Office   I discussed the limitations of evaluation and management by telemedicine and the availability of in person appointments. The patient expressed understanding and agreed to proceed.    History of Present Illness: Nicholas Fox is a 62 y.o. who identifies as a male who was assigned male at birth, and is being seen today for URI symptoms.  HPI: Cough This is a new problem. The current episode started 1 to 4 weeks ago. The problem has been gradually worsening. The problem occurs constantly. The cough is Productive of sputum and productive of purulent sputum. Associated symptoms include nasal congestion, postnasal drip, rhinorrhea and a sore throat (in the mornings when he wakes up from snoring from nasal congestion). Pertinent negatives include no chest pain, chills, ear congestion, ear pain, fever, headaches, myalgias, shortness of breath or wheezing. The symptoms are aggravated by lying down. Treatments tried: dayquil, nyquil. The treatment provided no relief. There is no history of asthma.     Problems:  Patient Active Problem List   Diagnosis Date Noted   PCP NOTES >>>>>>.. 09/10/2017   Skin cancer 11/18/2014   Annual physical exam 08/26/2011   Multiple thyroid nodules 02/27/2009    Allergies: No Known Allergies Medications:  Current Outpatient Medications:    amoxicillin-clavulanate (AUGMENTIN) 875-125 MG tablet, Take 1 tablet by mouth 2 (two) times daily., Disp: 20 tablet, Rfl: 0   promethazine-dextromethorphan (PROMETHAZINE-DM) 6.25-15 MG/5ML syrup, Take 5 mLs by mouth 4 (four) times  daily as needed., Disp: 118 mL, Rfl: 0  Observations/Objective: Patient is well-developed, well-nourished in no acute distress.  Resting comfortably at home.  Head is normocephalic, atraumatic.  No labored breathing.  Speech is clear and coherent with logical content.  Patient is alert and oriented at baseline.    Assessment and  Plan: 1. Bacterial upper respiratory infection (Primary) - amoxicillin-clavulanate (AUGMENTIN) 875-125 MG tablet; Take 1 tablet by mouth 2 (two) times daily.  Dispense: 20 tablet; Refill: 0 - promethazine-dextromethorphan (PROMETHAZINE-DM) 6.25-15 MG/5ML syrup; Take 5 mLs by mouth 4 (four) times daily as needed.  Dispense: 118 mL; Refill: 0  - Worsening over a week despite OTC medications - Will treat with Augmentin and Promethazine DM - Can continue Mucinex  - Push fluids.  - Rest.  - Steam and humidifier can help - Seek in person evaluation if worsening or symptoms fail to improve    Follow Up Instructions: I discussed the assessment and treatment plan with the patient. The patient was provided an opportunity to ask questions and all were answered. The patient agreed with the plan and demonstrated an understanding of the instructions.  A copy of instructions were sent to the patient via MyChart unless otherwise noted below.    The patient was advised to call back or seek an in-person evaluation if the symptoms worsen or if the condition fails to improve as anticipated.    Margaretann Loveless, PA-C

## 2023-07-01 NOTE — Patient Instructions (Signed)
Nicholas Fox, thank you for joining Margaretann Loveless, PA-C for today's virtual visit.  While this provider is not your primary care provider (PCP), if your PCP is located in our provider database this encounter information will be shared with them immediately following your visit.   A [place] MyChart account gives you access to today's visit and all your visits, tests, and labs performed at [facility] " click here if you don't have a [facility] MyChart account or go to [email or web address]  Consent: (Patient) Nicholas Fox provided verbal consent for this virtual visit at the beginning of the encounter.  Current Medications:  Current Outpatient Medications:    amoxicillin-clavulanate (AUGMENTIN) 875-125 MG tablet, Take 1 tablet by mouth 2 (two) times daily., Disp: 20 tablet, Rfl: 0   promethazine-dextromethorphan (PROMETHAZINE-DM) 6.25-15 MG/5ML syrup, Take 5 mLs by mouth 4 (four) times daily as needed., Disp: 118 mL, Rfl: 0   Medications ordered in this encounter:  Meds ordered this encounter  Medications   amoxicillin-clavulanate (AUGMENTIN) 875-125 MG tablet    Sig: Take 1 tablet by mouth 2 (two) times daily.    Dispense:  20 tablet    Refill:  0    Supervising Provider:   Merrilee Jansky [[id number]]   promethazine-dextromethorphan (PROMETHAZINE-DM) 6.25-15 MG/5ML syrup    Sig: Take 5 mLs by mouth 4 (four) times daily as needed.    Dispense:  118 mL    Refill:  0    Supervising Provider:   Merrilee Jansky [[id number]]     *If you need refills on other medications prior to your next appointment, please contact your pharmacy*  Follow-Up: Call back or seek an in-person evaluation if the symptoms worsen or if the condition fails to improve as anticipated.  [facility] [phone]  Other Instructions Upper Respiratory Infection, Adult An upper respiratory infection (URI) is a common viral infection of the nose, throat, and upper air  passages that lead to the lungs. The most common type of URI is the common cold. URIs usually get better on their own, without medical treatment. What are the causes? A URI is caused by a virus. You may catch a virus by: Breathing in droplets from an infected person's cough or sneeze. Touching something that has been exposed to the virus (is contaminated) and then touching your mouth, nose, or eyes. What increases the risk? You are more likely to get a URI if: You are very young or very old. You have close contact with others, such as at work, school, or a health care facility. You smoke. You have long-term (chronic) heart or lung disease. You have a weakened disease-fighting system (immune system). You have nasal allergies or asthma. You are experiencing a lot of stress. You have poor nutrition. What are the signs or symptoms? A URI usually involves some of the following symptoms: Runny or stuffy (congested) nose. Cough. Sneezing. Sore throat. Headache. Fatigue. Fever. Loss of appetite. Pain in your forehead, behind your eyes, and over your cheekbones (sinus pain). Muscle aches. Redness or irritation of the eyes. Pressure in the ears or face. How is this diagnosed? This condition may be diagnosed based on your medical history and symptoms, and a physical exam. Your health care provider may use a swab to take a mucus sample from your nose (nasal swab). This sample can be tested to determine what virus is causing the illness. How is this treated? URIs usually get better on their own within 7-10  days. Medicines cannot cure URIs, but your health care provider may recommend certain medicines to help relieve symptoms, such as: Over-the-counter cold medicines. Cough suppressants. Coughing is a type of defense against infection that helps to clear the respiratory system, so take these medicines only as recommended by your health care provider. Fever-reducing medicines. Follow these  instructions at home: Activity Rest as needed. If you have a fever, stay home from work or school until your fever is gone or until your health care provider says your URI cannot spread to other people (is no longer contagious). Your health care provider may have you wear a face mask to prevent your infection from spreading. Relieving symptoms Gargle with a mixture of salt and water 3-4 times a day or as needed. To make salt water, completely dissolve -1 tsp (3-6 g) of salt in 1 cup (237 mL) of warm water. Use a cool-mist humidifier to add moisture to the air. This can help you breathe more easily. Eating and drinking  Drink enough fluid to keep your urine pale yellow. Eat soups and other clear broths. General instructions  Take over-the-counter and prescription medicines only as told by your health care provider. These include cold medicines, fever reducers, and cough suppressants. Do not use any products that contain nicotine or tobacco. These products include cigarettes, chewing tobacco, and vaping devices, such as e-cigarettes. If you need help quitting, ask your health care provider. Stay away from secondhand smoke. Stay up to date on all immunizations, including the yearly (annual) flu vaccine. Keep all follow-up visits. This is important. How to prevent the spread of infection to others URIs can be contagious. To prevent the infection from spreading: Wash your hands with soap and water for at least 20 seconds. If soap and water are not available, use hand sanitizer. Avoid touching your mouth, face, eyes, or nose. Cough or sneeze into a tissue or your sleeve or elbow instead of into your hand or into the air.  Contact a health care provider if: You are getting worse instead of better. You have a fever or chills. Your mucus is brown or red. You have yellow or brown discharge coming from your nose. You have pain in your face, especially when you bend forward. You have swollen neck  glands. You have pain while swallowing. You have white areas in the back of your throat. Get help right away if: You have shortness of breath that gets worse. You have severe or persistent: Headache. Ear pain. Sinus pain. Chest pain. You have chronic lung disease along with any of the following: Making high-pitched whistling sounds when you breathe, most often when you breathe out (wheezing). Prolonged cough (more than 14 days). Coughing up blood. A change in your usual mucus. You have a stiff neck. You have changes in your: Vision. Hearing. Thinking. Mood. These symptoms may be an emergency. Get help right away. Call 911. Do not wait to see if the symptoms will go away. Do not drive yourself to the hospital. Summary An upper respiratory infection (URI) is a common infection of the nose, throat, and upper air passages that lead to the lungs. A URI is caused by a virus. URIs usually get better on their own within 7-10 days. Medicines cannot cure URIs, but your health care provider may recommend certain medicines to help relieve symptoms. This information is not intended to replace advice given to you by your health care provider. Make sure you discuss any questions you have with your health  care provider. Document Revised: 02/04/2021 Document Reviewed: 02/04/2021 Elsevier Patient Education  2024 Elsevier Inc.    If you have been instructed to have an in-person evaluation today at a local Urgent Care facility, please use the link below. It will take you to a list of all of our available [facility], including address, phone number and hours of operation. Please do not delay care.  [facility]  If you or a family member do not have a primary care provider, use the link below to schedule a visit and establish care. When you choose a [facility] primary care physician or advanced practice provider, you gain a long-term partner in health. Find a Primary  Care Provider  Learn more about [facility]'s in-office and virtual care options:  - Get Care Now

## 2023-07-07 DIAGNOSIS — C44519 Basal cell carcinoma of skin of other part of trunk: Secondary | ICD-10-CM | POA: Diagnosis not present

## 2023-07-07 DIAGNOSIS — L988 Other specified disorders of the skin and subcutaneous tissue: Secondary | ICD-10-CM | POA: Diagnosis not present

## 2023-07-26 ENCOUNTER — Other Ambulatory Visit: Payer: Self-pay

## 2023-07-26 DIAGNOSIS — R972 Elevated prostate specific antigen [PSA]: Secondary | ICD-10-CM

## 2023-08-11 ENCOUNTER — Other Ambulatory Visit (INDEPENDENT_AMBULATORY_CARE_PROVIDER_SITE_OTHER): Payer: BC Managed Care – PPO

## 2023-08-11 ENCOUNTER — Encounter: Payer: Self-pay | Admitting: Internal Medicine

## 2023-08-11 DIAGNOSIS — R972 Elevated prostate specific antigen [PSA]: Secondary | ICD-10-CM

## 2023-08-11 LAB — PSA: PSA: 2.61 ng/mL (ref 0.10–4.00)

## 2023-09-06 DIAGNOSIS — L814 Other melanin hyperpigmentation: Secondary | ICD-10-CM | POA: Diagnosis not present

## 2023-09-06 DIAGNOSIS — L570 Actinic keratosis: Secondary | ICD-10-CM | POA: Diagnosis not present

## 2023-09-06 DIAGNOSIS — L821 Other seborrheic keratosis: Secondary | ICD-10-CM | POA: Diagnosis not present

## 2023-09-06 DIAGNOSIS — L578 Other skin changes due to chronic exposure to nonionizing radiation: Secondary | ICD-10-CM | POA: Diagnosis not present

## 2023-09-06 DIAGNOSIS — D225 Melanocytic nevi of trunk: Secondary | ICD-10-CM | POA: Diagnosis not present

## 2023-10-27 IMAGING — CT CT CARDIAC CORONARY ARTERY CALCIUM SCORE
2 series · 15 of 20 positions shown, 17 images · non-contrast
Comparison: None Available.
COMPARISON: None Available.

Addendum:
CLINICAL DATA: This over-read does not include interpretation of
cardiac or coronary anatomy or pathology. The interpretation by the
cardiologist is attached.
CLINICAL DATA: Cardiovascular disease risk stratification

CAD screening, low CAD risk
EXAM:
CT Coronary Calcium Score
TECHNIQUE: A gated, non-contrast computed tomography scan of the heart was
performed using 3mm slice thickness. Axial images were analyzed on a
dedicated workstation. Calcium scoring of the coronary arteries was
performed using the Agatston method.

[Series 3: cascseq 2.0 b35f 70% · axial · 0.39mm/px · z∈[+1071,+1161]mm · 7 of 69 slices shown]
[im 8/69  vessel]
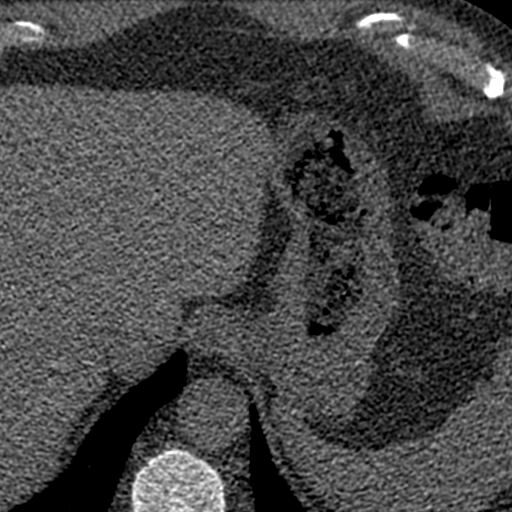
[im 16/69  vessel]
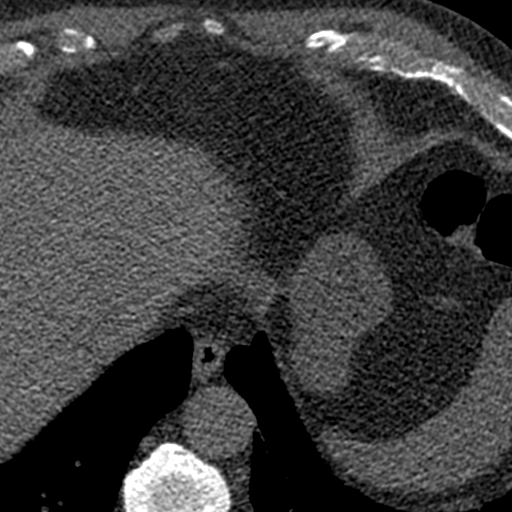
[im 23/69  vessel]
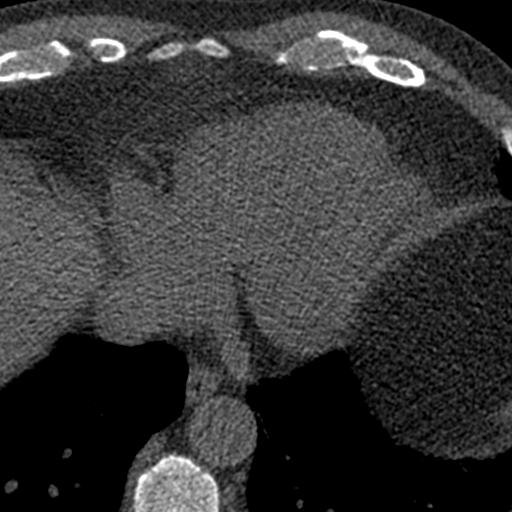
[im 31/69  vessel]
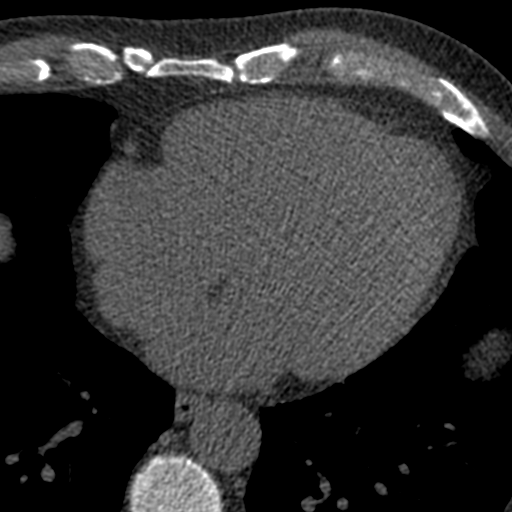
[im 38/69  vessel]
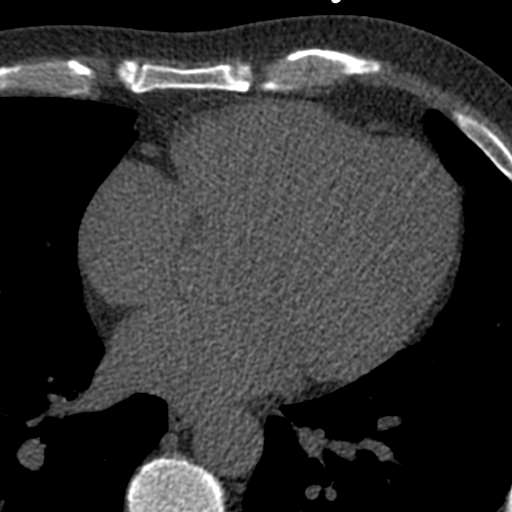
[im 46/69  vessel]
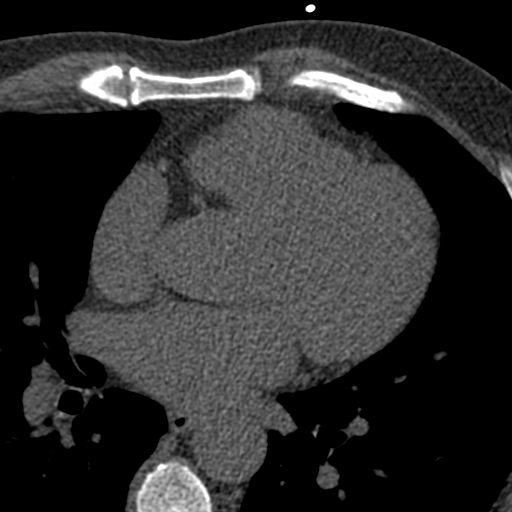
[im 53/69  vessel]
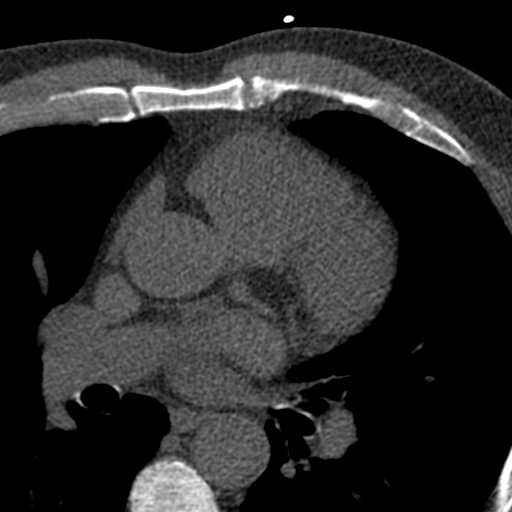

[Series 4: ax st full fov · axial · 0.90mm/px · z∈[+1071,+1177]mm · 8 of 69 slices shown, 10 images]
[im 8/69  vessel]
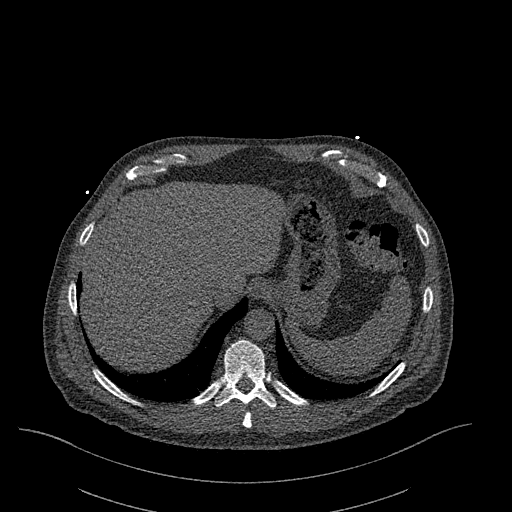
[im 8/69  lung]
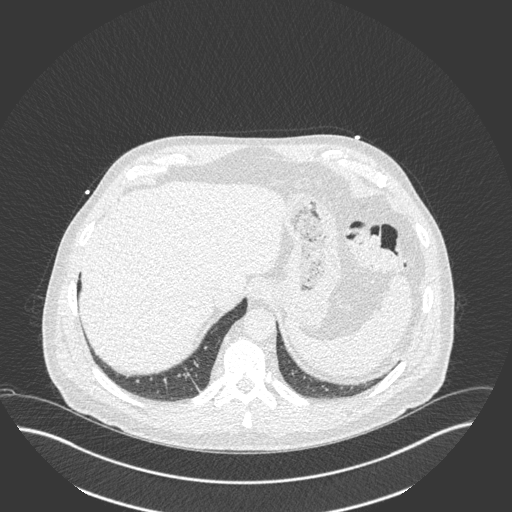
[im 16/69  vessel]
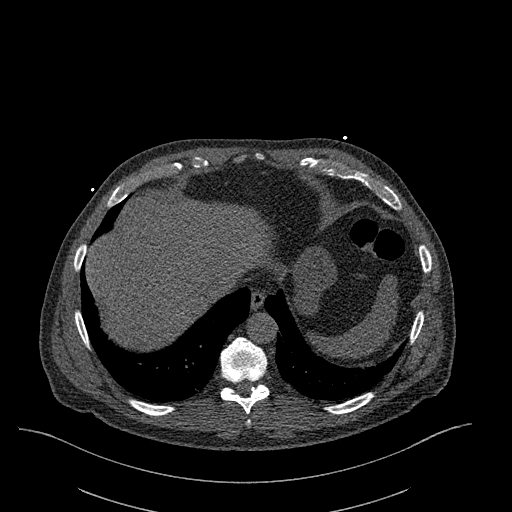
[im 23/69  vessel]
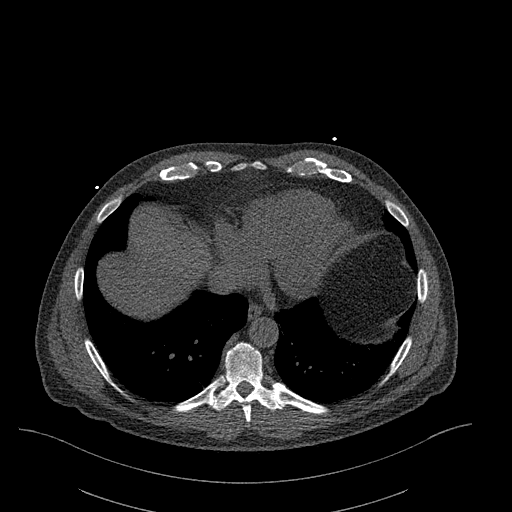
[im 31/69  vessel]
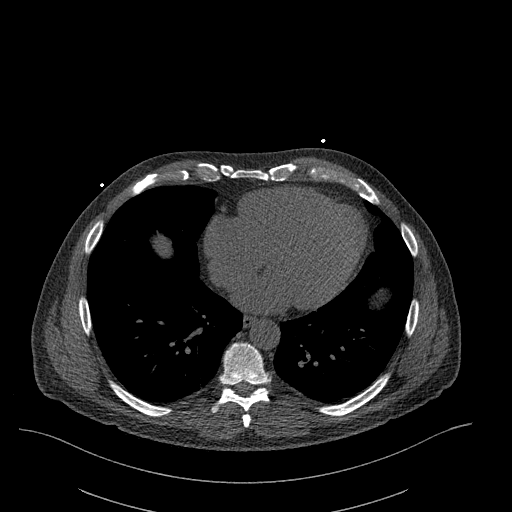
[im 38/69  vessel]
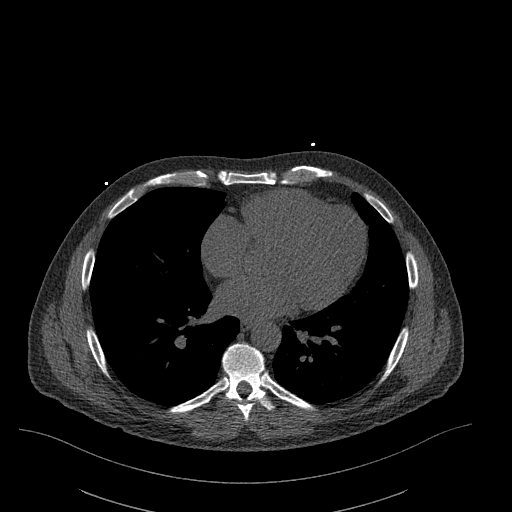
[im 38/69  lung]
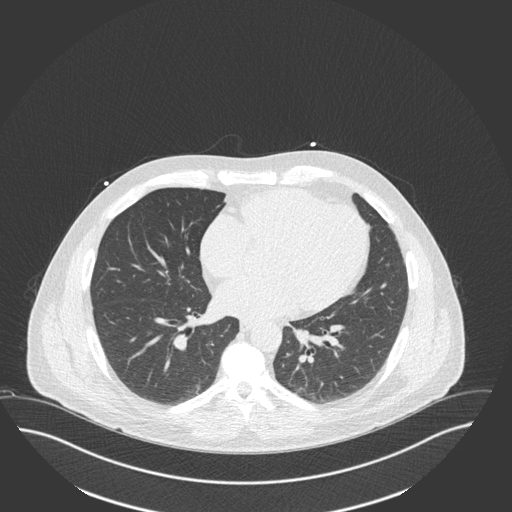
[im 46/69  vessel]
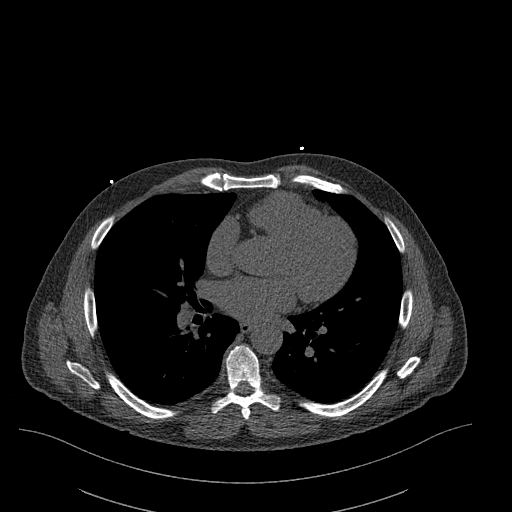
[im 53/69  vessel]
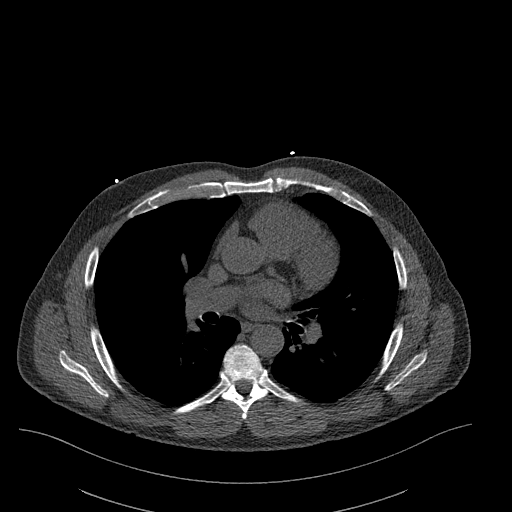
[im 61/69  vessel]
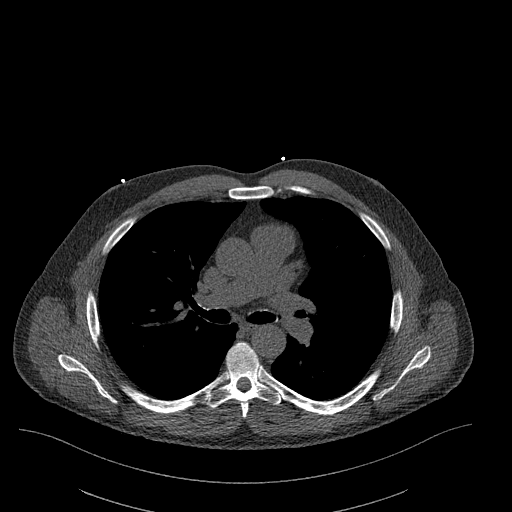

[15 of 20 positions shown; findings below may reference images not displayed]

FINDINGS: Limited view of the lung parenchyma demonstrates no suspicious
nodularity. Airways are normal.

Limited view of the mediastinum demonstrates no adenopathy.
Esophagus normal.

Limited view of the upper abdomen unremarkable.

Limited view of the skeleton and chest wall is unremarkable.
IMPRESSION: No significant extracardiac findings.
FINDINGS: Coronary Calcium Score:

Left main: 0

Left anterior descending artery:

Left circumflex artery: 0

Right coronary artery: .51

Total:

Percentile: 31st

Pericardium: Normal.

Ascending Aorta: Normal caliber. Ascending aorta measures
approximately 34mm at the mid ascending aorta measured in an axial
plane.

Non-cardiac: See separate report from [REDACTED].
IMPRESSION: Coronary calcium score of 5.8. This was 31st percentile for age-,
race-, and sex-matched controls.



If CAC=0, it is reasonable to withhold statin therapy and reassess
in 5 to 10 years, as long as higher risk conditions are absent
(diabetes mellitus, family history of premature CHD in first degree
relatives (males <55 years; females <65 years), cigarette smoking,
or LDL >=190 mg/dL).

If CAC is 1 to 99, it is reasonable to initiate statin therapy for
patients >=55 years of age.

If CAC is >=100 or >=75th percentile, it is reasonable to initiate
statin therapy at any age.

Cardiology referral should be considered for patients with CAC
scores >=400 or >=75th percentile.

*0363 AHA/ACC/AACVPR/AAPA/ABC/KLPIGBB/TIGER/PLANCIC/[place]/KAROL/ANDREINA/MOOLMAN
Guideline on the Management of Blood Cholesterol: A Report of the
American College of Cardiology/American Heart Association Task Force
on Clinical Practice Guidelines. J Am Coll Cardiol.
6645;73(24):9231-9802.

*** End of Addendum ***
FINDINGS: Limited view of the lung parenchyma demonstrates no suspicious
nodularity. Airways are normal.

Limited view of the mediastinum demonstrates no adenopathy.
Esophagus normal.

Limited view of the upper abdomen unremarkable.

Limited view of the skeleton and chest wall is unremarkable.
IMPRESSION: No significant extracardiac findings.

## 2023-12-16 DIAGNOSIS — C4491 Basal cell carcinoma of skin, unspecified: Secondary | ICD-10-CM | POA: Insufficient documentation

## 2023-12-19 ENCOUNTER — Encounter: Payer: Self-pay | Admitting: Internal Medicine

## 2023-12-19 ENCOUNTER — Ambulatory Visit (INDEPENDENT_AMBULATORY_CARE_PROVIDER_SITE_OTHER): Payer: BC Managed Care – PPO | Admitting: Internal Medicine

## 2023-12-19 VITALS — BP 126/62 | HR 58 | Temp 97.8°F | Resp 16 | Ht 70.0 in | Wt 214.0 lb

## 2023-12-19 DIAGNOSIS — Z1211 Encounter for screening for malignant neoplasm of colon: Secondary | ICD-10-CM

## 2023-12-19 DIAGNOSIS — R972 Elevated prostate specific antigen [PSA]: Secondary | ICD-10-CM | POA: Diagnosis not present

## 2023-12-19 DIAGNOSIS — Z0000 Encounter for general adult medical examination without abnormal findings: Secondary | ICD-10-CM | POA: Diagnosis not present

## 2023-12-19 DIAGNOSIS — G4733 Obstructive sleep apnea (adult) (pediatric): Secondary | ICD-10-CM | POA: Diagnosis not present

## 2023-12-19 LAB — CBC WITH DIFFERENTIAL/PLATELET
Basophils Absolute: 0.1 10*3/uL (ref 0.0–0.1)
Basophils Relative: 0.7 % (ref 0.0–3.0)
Eosinophils Absolute: 0.2 10*3/uL (ref 0.0–0.7)
Eosinophils Relative: 3.3 % (ref 0.0–5.0)
HCT: 46.3 % (ref 39.0–52.0)
Hemoglobin: 15.8 g/dL (ref 13.0–17.0)
Lymphocytes Relative: 24.3 % (ref 12.0–46.0)
Lymphs Abs: 1.8 10*3/uL (ref 0.7–4.0)
MCHC: 34.2 g/dL (ref 30.0–36.0)
MCV: 92.1 fl (ref 78.0–100.0)
Monocytes Absolute: 0.6 10*3/uL (ref 0.1–1.0)
Monocytes Relative: 8.4 % (ref 3.0–12.0)
Neutro Abs: 4.7 10*3/uL (ref 1.4–7.7)
Neutrophils Relative %: 63.3 % (ref 43.0–77.0)
Platelets: 199 10*3/uL (ref 150.0–400.0)
RBC: 5.03 Mil/uL (ref 4.22–5.81)
RDW: 12.5 % (ref 11.5–15.5)
WBC: 7.4 10*3/uL (ref 4.0–10.5)

## 2023-12-19 LAB — LIPID PANEL
Cholesterol: 197 mg/dL (ref 0–200)
HDL: 30.6 mg/dL — ABNORMAL LOW (ref >39.00)
LDL Cholesterol: 124 mg/dL — ABNORMAL HIGH (ref 0–99)
NonHDL: 166.13
Total CHOL/HDL Ratio: 6
Triglycerides: 209 mg/dL — ABNORMAL HIGH (ref 0.0–149.0)
VLDL: 41.8 mg/dL — ABNORMAL HIGH (ref 0.0–40.0)

## 2023-12-19 LAB — COMPREHENSIVE METABOLIC PANEL WITH GFR
ALT: 15 U/L (ref 0–53)
AST: 20 U/L (ref 0–37)
Albumin: 4.7 g/dL (ref 3.5–5.2)
Alkaline Phosphatase: 62 U/L (ref 39–117)
BUN: 19 mg/dL (ref 6–23)
CO2: 28 meq/L (ref 19–32)
Calcium: 9.8 mg/dL (ref 8.4–10.5)
Chloride: 104 meq/L (ref 96–112)
Creatinine, Ser: 1.05 mg/dL (ref 0.40–1.50)
GFR: 75.12 mL/min (ref >60.00)
Glucose, Bld: 98 mg/dL (ref 70–99)
Potassium: 4.8 meq/L (ref 3.5–5.1)
Sodium: 139 meq/L (ref 135–145)
Total Bilirubin: 0.5 mg/dL (ref 0.2–1.2)
Total Protein: 6.9 g/dL (ref 6.0–8.3)

## 2023-12-19 LAB — PSA: PSA: 2.45 ng/mL (ref 0.10–4.00)

## 2023-12-19 NOTE — Patient Instructions (Addendum)
 You can reach to gastroenterology, Dr. Elvin Hammer, to set up your colonoscopy. [phone]   Vaccines I recommend: Covid booster Shingrix (shingles) #2 Flu shot every fall  Cholesterol medication, statins, are a consideration      GO TO THE LAB :  Get the blood work   Your results will be posted on MyChart with my comments  Next office visit for a physical exam in 1 year Please make an appointment before you leave today        "Health Care Power of attorney" (Also know as a  "Living will" or  Advance care planning documents)  If you already have a living will or healthcare power of attorney, is recommended you bring the copy to be scanned in your chart.   The document will be available to all the doctors you see in the system.  If you are over 64 y/o and don't have the document, please read:  Advance care planning is a process that supports adults in  understanding and sharing their preferences regarding future medical care.  The patient's preferences are recorded in documents called Advance Directives and the can be modified at any time while the patient is in full mental capacity.     More information at: [email or web address]

## 2023-12-19 NOTE — Progress Notes (Signed)
 Subjective:    Patient ID: Nicholas Fox, male    DOB: [birth year], 63 y.o.   MRN: [id number]  DOS:  12/19/2023 Type of visit - description: CPX  Here for CPX. Feeling well. No concerns.  Review of Systems  A 14 point review of systems is negative    Past Medical History:  Diagnosis Date   Skin cancer    Sees dermatology   Thyroid  nodule    s/p U/S x 3, last 03-2010    Past Surgical History:  Procedure Laterality Date   VASECTOMY     WISDOM TOOTH EXTRACTION  1979   Social History   Socioeconomic History   Marital status: Married    Spouse name: Not on file   Number of children: 3   Years of education: Not on file   Highest education level: Not on file  Occupational History   Occupation: Real Secondary school teacher- hotel, business  Tobacco Use   Smoking status: Never   Smokeless tobacco: Never  Substance and Sexual Activity   Alcohol use: Yes    Alcohol/week: 1.0 standard drink of alcohol    Types: 1 Cans of beer per week    Comment: social   Drug use: No   Sexual activity: Not on file  Other Topics Concern   Not on file  Social History Narrative   Household: pt and wife   Social Drivers of Corporate investment banker Strain: Not on file  Food Insecurity: Not on file  Transportation Needs: Not on file  Physical Activity: Not on file  Stress: Not on file  Social Connections: Not on file  Intimate Partner Violence: Not on file    No current outpatient medications      Objective:   Physical Exam BP 126/62   Pulse (!) 58   Temp 97.8 F (36.6 C) (Oral)   Resp 16   Ht 5\' 10"  (1.778 m)   Wt 214 lb (97.1 kg)   SpO2 98%   BMI 30.71 kg/m  General: Well developed, NAD, BMI noted Neck: No  thyromegaly  HEENT:  Normocephalic . Face symmetric, atraumatic Lungs:  CTA B Normal respiratory effort, no intercostal retractions, no accessory muscle use. Heart: RRR,  no murmur.  Abdomen:  Not distended, soft, non-tender. No rebound or rigidity.   Lower  extremities: no pretibial edema bilaterally DRE: Normal sphincter tone, no stools.  Prostate normal Skin: Exposed areas without rash. Not pale. Not jaundice Neurologic:  alert & oriented X3.  Speech normal, gait appropriate for age and unassisted Strength symmetric and appropriate for age.  Psych: Cognition and judgment appear intact.  Cooperative with normal attention span and concentration.  Behavior appropriate. No anxious or depressed appearing.     Assessment    Assessment Thyroid  nodules: US  2013 stable, 2021 stable  BCC- Sees derm q 6 months   OSA: dx 2021 , uses a CPAP  E.D.  PLAN Here for CPX -Td 08-2017 - s/p shingrex #1, had some s/e but rec 2nd dose  -Vaccines rec:   flu shot q fall, Shingrix No. 2, covid vax is an option -CCS: Colonoscopy 02/2014 - moderate tics, no polyps, next cscope  02/2024.  Encouraged to call GI set follow-up -prostate ca screening: PSA velocity was slightly increased,  no symptoms, DRE negative.  Check PSA -Labs: CMP FLP CBC PSA -Lifestyle: Exercises very good, has made progress on his diet. Other issues: BCC: Sees dermatology OSA: Uses a CPAP Cardiovascular risk: Last year risk  was calculated with a MESA score and it was borderline, checking an FLP today and will recalculate. RTC 1 year

## 2023-12-19 NOTE — Assessment & Plan Note (Signed)
 Here for CPX  Other issues: BCC: Sees dermatology OSA: Uses a CPAP Cardiovascular risk: Last year risk was calculated with a MESA score and it was borderline, checking an FLP today and will recalculate. RTC 1 year

## 2023-12-19 NOTE — Assessment & Plan Note (Signed)
 Here for CPX -Td 08-2017 - s/p shingrex #1, had some s/e but rec 2nd dose  -Vaccines rec:   flu shot q fall, Shingrix No. 2, covid vax is an option -CCS: Colonoscopy 02/2014 - moderate tics, no polyps, next cscope  02/2024.  Encouraged to call GI set follow-up -prostate ca screening: PSA velocity was slightly increased,  no symptoms, DRE negative.  Check PSA -Labs: CMP FLP CBC PSA -Lifestyle: Exercises very good, has made progress on his diet.

## 2023-12-21 ENCOUNTER — Ambulatory Visit: Payer: Self-pay | Admitting: Internal Medicine

## 2024-01-18 DIAGNOSIS — G4733 Obstructive sleep apnea (adult) (pediatric): Secondary | ICD-10-CM | POA: Diagnosis not present

## 2024-02-18 DIAGNOSIS — G4733 Obstructive sleep apnea (adult) (pediatric): Secondary | ICD-10-CM | POA: Diagnosis not present

## 2024-03-07 DIAGNOSIS — D225 Melanocytic nevi of trunk: Secondary | ICD-10-CM | POA: Diagnosis not present

## 2024-03-07 DIAGNOSIS — D485 Neoplasm of uncertain behavior of skin: Secondary | ICD-10-CM | POA: Diagnosis not present

## 2024-03-07 DIAGNOSIS — L570 Actinic keratosis: Secondary | ICD-10-CM | POA: Diagnosis not present

## 2024-03-07 DIAGNOSIS — L578 Other skin changes due to chronic exposure to nonionizing radiation: Secondary | ICD-10-CM | POA: Diagnosis not present

## 2024-03-23 DIAGNOSIS — L739 Follicular disorder, unspecified: Secondary | ICD-10-CM | POA: Diagnosis not present

## 2024-03-23 DIAGNOSIS — D485 Neoplasm of uncertain behavior of skin: Secondary | ICD-10-CM | POA: Diagnosis not present

## 2024-03-23 DIAGNOSIS — L570 Actinic keratosis: Secondary | ICD-10-CM | POA: Diagnosis not present

## 2024-03-23 DIAGNOSIS — B078 Other viral warts: Secondary | ICD-10-CM | POA: Diagnosis not present

## 2024-03-23 DIAGNOSIS — L820 Inflamed seborrheic keratosis: Secondary | ICD-10-CM | POA: Diagnosis not present

## 2024-03-23 DIAGNOSIS — C44319 Basal cell carcinoma of skin of other parts of face: Secondary | ICD-10-CM | POA: Diagnosis not present

## 2024-03-23 DIAGNOSIS — C4441 Basal cell carcinoma of skin of scalp and neck: Secondary | ICD-10-CM | POA: Diagnosis not present

## 2024-06-12 DIAGNOSIS — C4441 Basal cell carcinoma of skin of scalp and neck: Secondary | ICD-10-CM | POA: Diagnosis not present

## 2024-12-19 ENCOUNTER — Encounter: Admitting: Internal Medicine
